# Patient Record
Sex: Female | Born: 1945 | Race: White | Hispanic: No | Marital: Married | Smoking: Never smoker
Health system: Southern US, Community
[De-identification: ages and names within clinical notes are randomized; demographics above are authoritative.]

## PROBLEM LIST (undated history)

## (undated) DIAGNOSIS — I825Z9 Chronic embolism and thrombosis of unspecified deep veins of unspecified distal lower extremity: Secondary | ICD-10-CM

## (undated) DIAGNOSIS — G459 Transient cerebral ischemic attack, unspecified: Secondary | ICD-10-CM

## (undated) DIAGNOSIS — I471 Supraventricular tachycardia, unspecified: Secondary | ICD-10-CM

## (undated) DIAGNOSIS — I6529 Occlusion and stenosis of unspecified carotid artery: Secondary | ICD-10-CM

## (undated) DIAGNOSIS — I059 Rheumatic mitral valve disease, unspecified: Secondary | ICD-10-CM

## (undated) DIAGNOSIS — I739 Peripheral vascular disease, unspecified: Secondary | ICD-10-CM

## (undated) DIAGNOSIS — G8929 Other chronic pain: Secondary | ICD-10-CM

## (undated) DIAGNOSIS — M549 Dorsalgia, unspecified: Secondary | ICD-10-CM

## (undated) DIAGNOSIS — E782 Mixed hyperlipidemia: Secondary | ICD-10-CM

## (undated) DIAGNOSIS — Z86718 Personal history of other venous thrombosis and embolism: Secondary | ICD-10-CM

## (undated) DIAGNOSIS — E539 Vitamin B deficiency, unspecified: Secondary | ICD-10-CM

## (undated) DIAGNOSIS — I079 Rheumatic tricuspid valve disease, unspecified: Secondary | ICD-10-CM

## (undated) DIAGNOSIS — R002 Palpitations: Secondary | ICD-10-CM

## (undated) DIAGNOSIS — F411 Generalized anxiety disorder: Secondary | ICD-10-CM

## (undated) DIAGNOSIS — M797 Fibromyalgia: Secondary | ICD-10-CM

## (undated) DIAGNOSIS — Z86711 Personal history of pulmonary embolism: Secondary | ICD-10-CM

## (undated) DIAGNOSIS — E7800 Pure hypercholesterolemia, unspecified: Secondary | ICD-10-CM

## (undated) DIAGNOSIS — I499 Cardiac arrhythmia, unspecified: Secondary | ICD-10-CM

## (undated) DIAGNOSIS — Z8781 Personal history of (healed) traumatic fracture: Secondary | ICD-10-CM

## (undated) DIAGNOSIS — M810 Age-related osteoporosis without current pathological fracture: Secondary | ICD-10-CM

## (undated) DIAGNOSIS — I10 Essential (primary) hypertension: Secondary | ICD-10-CM

## (undated) DIAGNOSIS — I429 Cardiomyopathy, unspecified: Secondary | ICD-10-CM

## (undated) HISTORY — DX: Pure hypercholesterolemia, unspecified: E78.00

## (undated) HISTORY — PX: OTHER SURGICAL HISTORY: SHX169

## (undated) HISTORY — DX: Dorsalgia, unspecified: M54.9

## (undated) HISTORY — DX: Other chronic pain: G89.29

## (undated) HISTORY — DX: Personal history of other venous thrombosis and embolism: Z86.718

---

## 2014-12-10 ENCOUNTER — Other Ambulatory Visit (INDEPENDENT_AMBULATORY_CARE_PROVIDER_SITE_OTHER): Payer: Self-pay | Admitting: Internal Medicine

## 2014-12-10 ENCOUNTER — Ambulatory Visit (INDEPENDENT_AMBULATORY_CARE_PROVIDER_SITE_OTHER): Payer: Medicare Other | Admitting: Internal Medicine

## 2014-12-10 ENCOUNTER — Encounter (INDEPENDENT_AMBULATORY_CARE_PROVIDER_SITE_OTHER): Payer: Self-pay | Admitting: Internal Medicine

## 2014-12-10 ENCOUNTER — Telehealth (INDEPENDENT_AMBULATORY_CARE_PROVIDER_SITE_OTHER): Payer: Self-pay | Admitting: *Deleted

## 2014-12-10 VITALS — BP 100/60 | HR 64 | Temp 98.1°F | Ht 67.0 in | Wt 189.0 lb

## 2014-12-10 DIAGNOSIS — M4120 Other idiopathic scoliosis, site unspecified: Secondary | ICD-10-CM

## 2014-12-10 DIAGNOSIS — M810 Age-related osteoporosis without current pathological fracture: Secondary | ICD-10-CM

## 2014-12-10 DIAGNOSIS — E7800 Pure hypercholesterolemia, unspecified: Secondary | ICD-10-CM | POA: Insufficient documentation

## 2014-12-10 DIAGNOSIS — K5730 Diverticulosis of large intestine without perforation or abscess without bleeding: Secondary | ICD-10-CM

## 2014-12-10 DIAGNOSIS — I809 Phlebitis and thrombophlebitis of unspecified site: Secondary | ICD-10-CM | POA: Diagnosis not present

## 2014-12-10 DIAGNOSIS — K219 Gastro-esophageal reflux disease without esophagitis: Secondary | ICD-10-CM

## 2014-12-10 MED ORDER — PEG 3350-KCL-NA BICARB-NACL 420 G PO SOLR: 4000.0000 mL | Freq: Once | ORAL | Status: DC

## 2014-12-10 NOTE — Telephone Encounter (Signed)
Patient needs trilyte 

## 2014-12-10 NOTE — Progress Notes (Addendum)
Subjective:    Patient ID: Ebony Olson, female    DOB: [birth year], 69 y.o.   MRN: [id number]  HPI Referred to our office by Dr. [facility]ian[facility], [place], [place]. Per notes in August she was seen at PCP for abdominal pain.  She underwent a CT  Which revealed mild diverticulosis centered in the sigmoid colon. No evidence of diverticulitis.   She had been taking Bactrim and Flagyl for the pain. She finished 2 days ago. Last Colonoscopy 09/01/2004 Dr. Elder Cyphers, left lower quadrant pain, tender to palpation. Normal colonoscopy other than diverticulosis. Her appetite is good. Weight loss 13 pounds recently which was intentional. She has minimal abdominal pain today. She denies fever or chills.  She usually has a BM every day.No melena or BRRB.  She has acid reflux if she does not eat right. If she eats properly, her acid reflux is controlled.   Hx of thrombophlebitis and maintained on Elquis and ASA.  Review of Systems   Past Medical History  Diagnosis Date  . Hx of blood clots     x 4 in rt leg  . Chronic back pain   . High cholesterol     Past Surgical History  Procedure Laterality Date  . Cyst on spine      Allergies  Allergen Reactions  . Penicillins     rash  . Prednisone     Could not breath    No current outpatient prescriptions on file prior to visit.   No current facility-administered medications on file prior to visit.   Current Outpatient Prescriptions  Medication Sig Dispense Refill  . albuterol (PROVENTIL HFA;VENTOLIN HFA) 108 (90 BASE) MCG/ACT inhaler Inhale into the lungs every 6 (six) hours as needed for wheezing or shortness of breath.    Marland Kitchen apixaban (ELIQUIS) 5 MG TABS tablet Take 5 mg by mouth 2 (two) times daily.    Marland Kitchen aspirin 81 MG tablet Take 81 mg by mouth daily.    . clonazePAM (KLONOPIN) 1 MG tablet Take 1 mg by mouth at bedtime.    Marland Kitchen denosumab (PROLIA) 60 MG/ML SOLN injection Inject 60 mg into the skin every 6 (six)  months. Administer in upper arm, thigh, or abdomen    . Dexlansoprazole (DEXILANT) 30 MG capsule Take 30 mg by mouth daily.    . furosemide (LASIX) 20 MG tablet Take 20 mg by mouth. 1/2 tab a day    . nortriptyline (PAMELOR) 50 MG capsule Take 100 mg by mouth at bedtime.    . simvastatin (ZOCOR) 80 MG tablet Take 80 mg by mouth daily.    . TraMADol HCl 50 MG TBDP Take by mouth.    . vitamin B-12 (CYANOCOBALAMIN) 1000 MCG tablet Take 1,000 mcg by mouth daily. Sq Once a month    . Vitamin D, Ergocalciferol, (DRISDOL) 50000 UNITS CAPS capsule Take 50,000 Units by mouth. One every two weeks     No current facility-administered medications for this visit.        Objective:   Physical Exam Blood pressure 100/60, pulse 64, temperature 98.1 F (36.7 C), height 5\' 7"  (1.702 m), weight 189 lb (85.73 kg). Alert and oriented. Skin warm and dry. Oral mucosa is moist.   . Sclera anicteric, conjunctivae is pink. Thyroid not enlarged. No cervical lymphadenopathy. Lungs clear. Heart regular rate and rhythm.  Abdomen is soft. Bowel sounds are positive. No hepatomegaly. No abdominal masses felt. No tenderness.  No edema to lower extremities.  Assessment & Plan:  Screening colonoscopy. The risks and benefits such as perforation, bleeding, and infection were reviewed with the patient and is agreeable. Will put out 8 weeks since she was empirically tx for diverticulitis.

## 2014-12-17 ENCOUNTER — Telehealth (INDEPENDENT_AMBULATORY_CARE_PROVIDER_SITE_OTHER): Payer: Self-pay | Admitting: *Deleted

## 2014-12-17 NOTE — Telephone Encounter (Signed)
Patient left message to cancel TCS sch'd 02/05/15, didn't give reason

## 2014-12-18 NOTE — Telephone Encounter (Signed)
noted 

## 2015-01-09 ENCOUNTER — Encounter (INDEPENDENT_AMBULATORY_CARE_PROVIDER_SITE_OTHER): Payer: Self-pay

## 2015-02-05 ENCOUNTER — Ambulatory Visit: Admit: 2015-02-05 | Payer: Self-pay | Admitting: Internal Medicine

## 2015-02-05 SURGERY — COLONOSCOPY
Anesthesia: Moderate Sedation

## 2015-10-23 ENCOUNTER — Other Ambulatory Visit: Payer: Self-pay | Admitting: Neurosurgery

## 2015-10-23 DIAGNOSIS — M47816 Spondylosis without myelopathy or radiculopathy, lumbar region: Secondary | ICD-10-CM

## 2015-10-30 ENCOUNTER — Ambulatory Visit
Admission: RE | Admit: 2015-10-30 | Discharge: 2015-10-30 | Disposition: A | Discharge status: Home / Self Care | Payer: Medicare Other | Source: Ambulatory Visit | Attending: Neurosurgery | Admitting: Neurosurgery

## 2015-10-30 DIAGNOSIS — M47816 Spondylosis without myelopathy or radiculopathy, lumbar region: Secondary | ICD-10-CM

## 2015-10-30 MED ORDER — METHYLPREDNISOLONE ACETATE 40 MG/ML INJ SUSP (RADIOLOG
120.0000 mg | Freq: Once | INTRAMUSCULAR | Status: AC
  Administered 2015-10-30: 120 mg via EPIDURAL

## 2015-10-30 MED ORDER — IOPAMIDOL (ISOVUE-M 200) INJECTION 41%
1.0000 mL | Freq: Once | INTRAMUSCULAR | Status: AC
  Administered 2015-10-30: 1 mL via EPIDURAL

## 2015-10-30 NOTE — Discharge Instructions (Addendum)

## 2015-11-05 ENCOUNTER — Other Ambulatory Visit: Payer: Self-pay | Admitting: Neurosurgery

## 2015-11-05 DIAGNOSIS — M47816 Spondylosis without myelopathy or radiculopathy, lumbar region: Secondary | ICD-10-CM

## 2015-11-13 ENCOUNTER — Ambulatory Visit
Admission: RE | Admit: 2015-11-13 | Discharge: 2015-11-13 | Disposition: A | Discharge status: Home / Self Care | Payer: Medicare Other | Source: Ambulatory Visit | Attending: Neurosurgery | Admitting: Neurosurgery

## 2015-11-13 DIAGNOSIS — M47816 Spondylosis without myelopathy or radiculopathy, lumbar region: Secondary | ICD-10-CM

## 2015-11-13 MED ORDER — IOPAMIDOL (ISOVUE-M 200) INJECTION 41%: 1.0000 mL | Freq: Once | INTRAMUSCULAR | Status: DC

## 2015-11-13 NOTE — Discharge Instructions (Addendum)
Bedrest Discharge Instructions  1. Go home and rest quietly for the next 24 hours.  It is important to lie flat for the next 24 hours.  Get up only to go to the restroom.  You may lie in the bed or on a couch on your back, your stomach, your left side or your right side.  You may have one pillow under your head.  You may have pillows between your knees while you are on your side or under your knees while you are on your back.  2. DO NOT drive today.  Recline the seat as far back as it will go, while still wearing your seat belt, on the way home.  3. You may get up to go to the bathroom as needed.  You may sit up for 10 minutes to eat.  You may resume your normal diet and medications unless otherwise indicated.  Drink lots of extra fluids today and tomorrow.  4. The incidence of headache, nausea, or vomiting is about 5% (one in 20 patients).  If you develop a headache, lie flat and drink plenty of fluids until the headache goes away.  Caffeinated beverages may be helpful.  If you develop severe nausea and vomiting or a headache that does not go away with flat bed rest, call the physician who sent you here.   5. You may resume normal activities after your 24 hours of bed rest is over; however, do not exert yourself strongly or do any heavy lifting tomorrow.  6. Call your physician for a follow-up appointment.   7. If you have any questions  after you arrive home, please call [phone].  Discharge instructions have been explained to the patient.  The patient, or the person responsible for the patient, fully understands these instructions.   Resume Eliquis today.

## 2015-11-13 NOTE — Progress Notes (Signed)
Called Dr. Pamala Hurry Alvarez's office to ask if Depo Medrol will interfere with her eye surgery scheduled in august. Office will call back after talking to the dr.

## 2015-11-14 ENCOUNTER — Telehealth: Payer: Self-pay | Admitting: Radiology

## 2015-11-14 NOTE — Telephone Encounter (Signed)
Called pt to let her know she could have her injection whenever she was ready.

## 2015-11-14 NOTE — Telephone Encounter (Signed)
Received call from Ebony Olson at [facility] that the steroid will have no effect on her eye surgery and she can have her injection when ever it suits her.

## 2016-09-22 ENCOUNTER — Other Ambulatory Visit: Payer: Self-pay | Admitting: Nurse Practitioner

## 2016-09-22 DIAGNOSIS — M47816 Spondylosis without myelopathy or radiculopathy, lumbar region: Secondary | ICD-10-CM

## 2016-09-29 ENCOUNTER — Ambulatory Visit
Admission: RE | Admit: 2016-09-29 | Discharge: 2016-09-29 | Disposition: A | Discharge status: Home / Self Care | Payer: Medicare Other | Source: Ambulatory Visit | Attending: Nurse Practitioner | Admitting: Nurse Practitioner

## 2016-09-29 DIAGNOSIS — M47816 Spondylosis without myelopathy or radiculopathy, lumbar region: Secondary | ICD-10-CM

## 2016-09-29 MED ORDER — IOPAMIDOL (ISOVUE-M 200) INJECTION 41%
1.0000 mL | Freq: Once | INTRAMUSCULAR | Status: AC
  Administered 2016-09-29: 1 mL via EPIDURAL

## 2016-09-29 MED ORDER — METHYLPREDNISOLONE ACETATE 40 MG/ML INJ SUSP (RADIOLOG
120.0000 mg | Freq: Once | INTRAMUSCULAR | Status: AC
  Administered 2016-09-29: 120 mg via EPIDURAL

## 2016-09-29 NOTE — Discharge Instructions (Signed)

## 2016-10-05 ENCOUNTER — Other Ambulatory Visit: Payer: Self-pay | Admitting: Nurse Practitioner

## 2016-10-05 DIAGNOSIS — M47816 Spondylosis without myelopathy or radiculopathy, lumbar region: Secondary | ICD-10-CM

## 2016-10-13 ENCOUNTER — Other Ambulatory Visit: Payer: Medicare Other

## 2016-10-14 ENCOUNTER — Ambulatory Visit
Admission: RE | Admit: 2016-10-14 | Discharge: 2016-10-14 | Disposition: A | Discharge status: Home / Self Care | Payer: Medicare Other | Source: Ambulatory Visit | Attending: Nurse Practitioner | Admitting: Nurse Practitioner

## 2016-10-14 DIAGNOSIS — M47816 Spondylosis without myelopathy or radiculopathy, lumbar region: Secondary | ICD-10-CM

## 2016-10-14 MED ORDER — IOPAMIDOL (ISOVUE-M 200) INJECTION 41%
1.0000 mL | Freq: Once | INTRAMUSCULAR | Status: AC
  Administered 2016-10-14: 1 mL via EPIDURAL

## 2016-10-14 MED ORDER — METHYLPREDNISOLONE ACETATE 40 MG/ML INJ SUSP (RADIOLOG
120.0000 mg | Freq: Once | INTRAMUSCULAR | Status: AC
  Administered 2016-10-14: 120 mg via EPIDURAL

## 2016-10-14 NOTE — Discharge Instructions (Signed)

## 2018-03-29 IMAGING — XA Imaging study
4 series · 4 of 4 positions shown · non-contrast
Comparison: none

CLINICAL DATA: Lumbosacral spondylosis without myelopathy. Right
greater than left low back pain. Numbness in lower extremities.
Partial response to the recent epidural injection at L3-4.

[Series 1: ortho standard · 1 of 1 slices shown (1 of 4)]
[im 1/1]
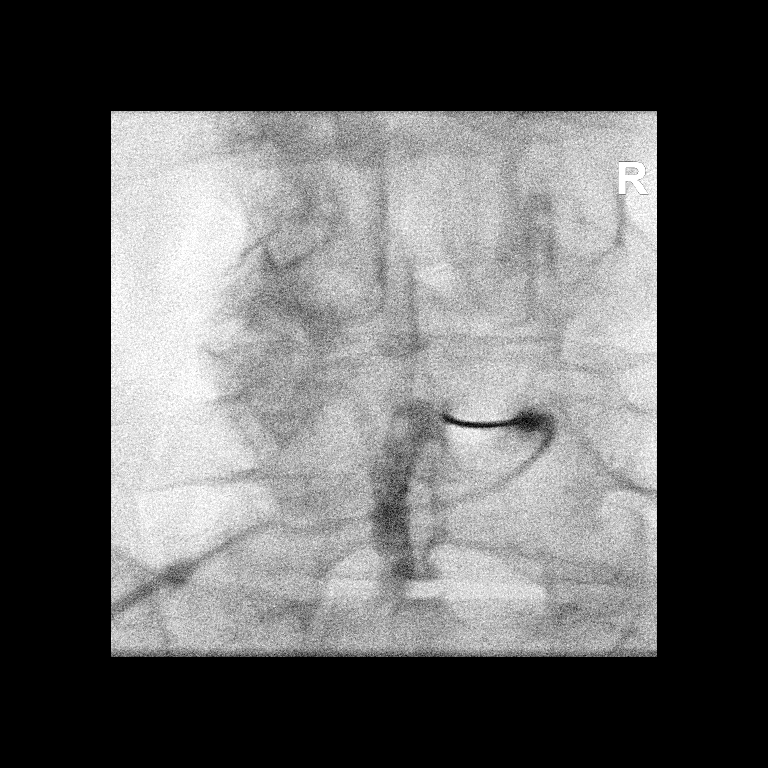

[Series 2: ortho standard · 1 of 1 slices shown (2 of 4)]
[im 1/1]
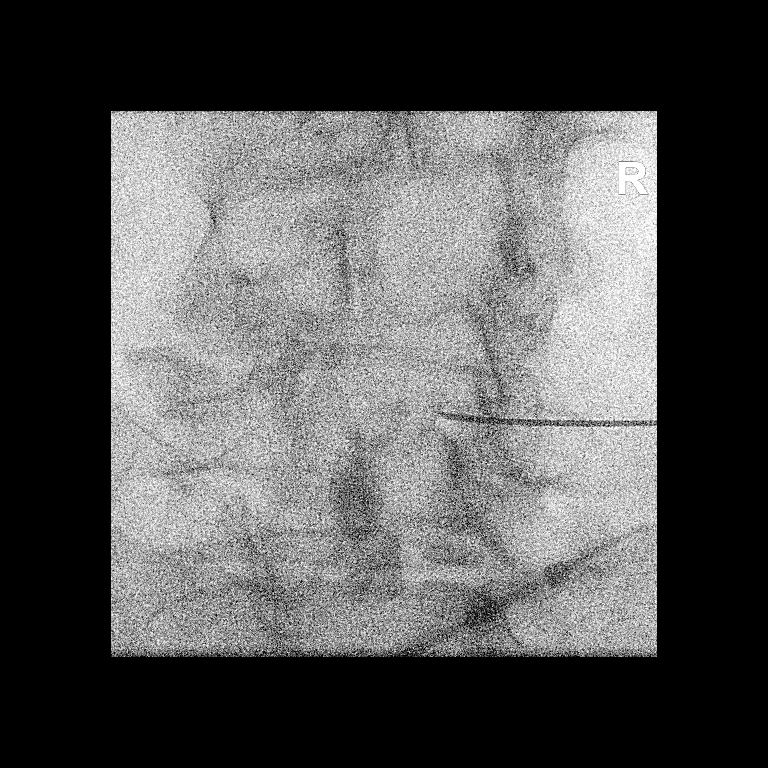

[Series 3: ortho standard · 1 of 1 slices shown (3 of 4)]
[im 1/1]
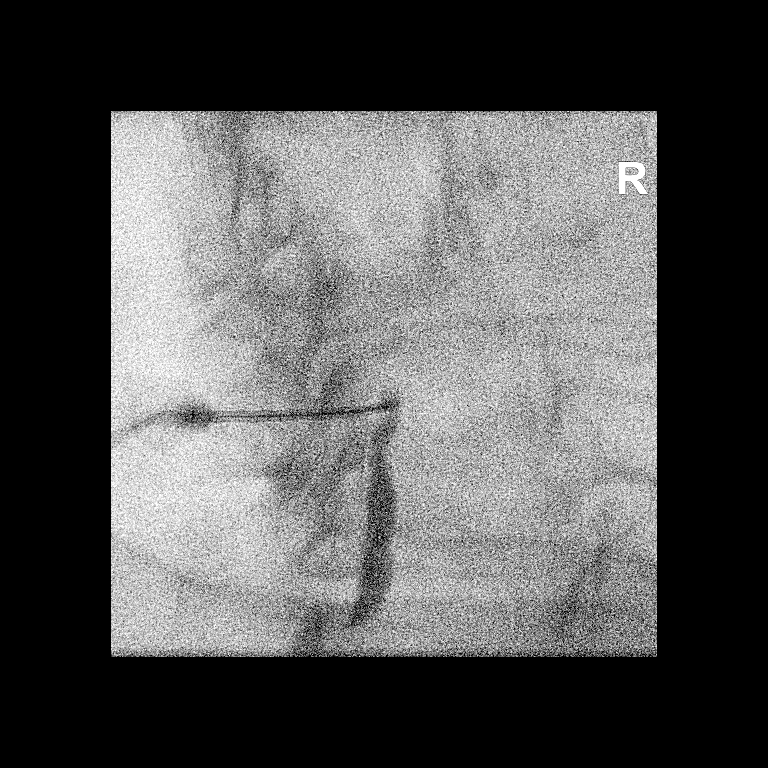

[Series 4: ortho standard · 1 of 1 slices shown (4 of 4)]
[im 1/1]
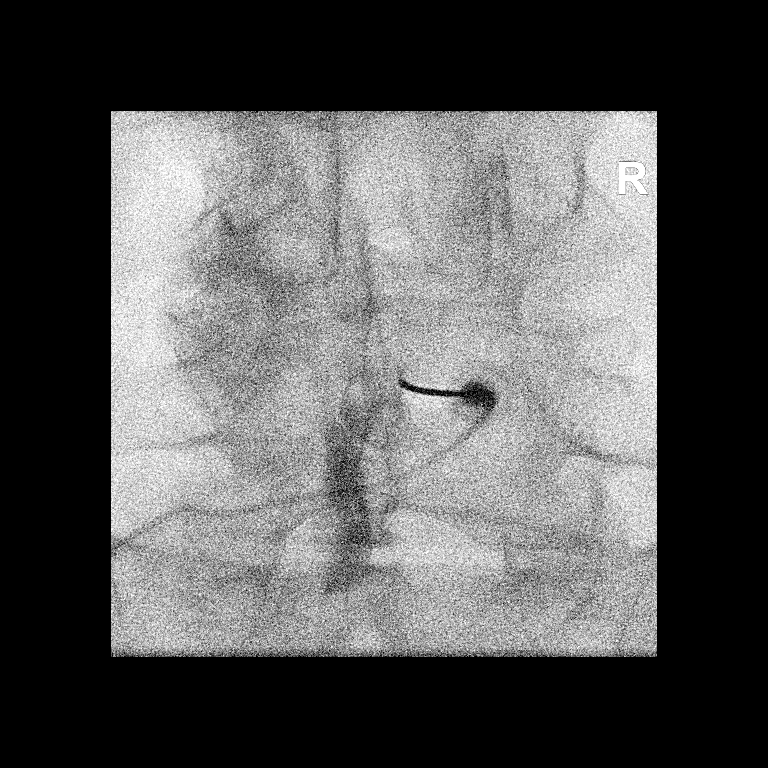

[4 of 4 positions shown; findings below may reference images not displayed]

FLUOROSCOPY TIME:  Radiation Exposure Index (as provided by the
fluoroscopic device): 10.50 microGray*m^2

Fluoroscopy Time (in minutes and seconds):  4 seconds

PROCEDURE:
The procedure, risks, benefits, and alternatives were explained to
the patient. Questions regarding the procedure were encouraged and
answered. The patient understands and consents to the procedure.

LUMBAR EPIDURAL INJECTION:

An interlaminar approach was performed on the right at L4-5. The
overlying skin was cleansed and anesthetized. A 3.5 inch 20 gauge
epidural needle was advanced using loss-of-resistance technique.

DIAGNOSTIC EPIDURAL INJECTION:

Injection of Isovue-M 200 shows a good epidural pattern with spread
above and below the level of needle placement. Contrast spread was
bilateral though with a significant caudal component crossing the
midline to the left despite right paramedian needle placement. No
vascular opacification is seen.

THERAPEUTIC EPIDURAL INJECTION:

120 mg of Depo-Medrol mixed with 3 mL of 1% lidocaine were
instilled. The procedure was well-tolerated, and the patient was
discharged thirty minutes following the injection in good condition.

COMPLICATIONS:
None
IMPRESSION: Technically successful lumbar interlaminar epidural injection at
L4-5.

## 2022-10-23 ENCOUNTER — Encounter (HOSPITAL_COMMUNITY): Payer: Self-pay

## 2022-10-24 ENCOUNTER — Inpatient Hospital Stay (HOSPITAL_COMMUNITY)
Admit: 2022-10-24 | Discharge: 2022-11-03 | DRG: 481 | Disposition: A | Discharge status: Skilled Nursing Facility | Payer: Medicare Other | Attending: Student | Admitting: Student

## 2022-10-24 ENCOUNTER — Inpatient Hospital Stay (HOSPITAL_COMMUNITY): Payer: Medicare Other | Admitting: Anesthesiology

## 2022-10-24 ENCOUNTER — Inpatient Hospital Stay (HOSPITAL_COMMUNITY): Payer: Medicare Other

## 2022-10-24 ENCOUNTER — Other Ambulatory Visit: Payer: Self-pay

## 2022-10-24 ENCOUNTER — Encounter (HOSPITAL_COMMUNITY): Payer: Self-pay | Admitting: Internal Medicine

## 2022-10-24 ENCOUNTER — Encounter (HOSPITAL_COMMUNITY)
Disposition: A | Discharge status: Skilled Nursing Facility | Payer: Self-pay | Source: Home / Self Care | Attending: Internal Medicine

## 2022-10-24 DIAGNOSIS — W1830XA Fall on same level, unspecified, initial encounter: Secondary | ICD-10-CM | POA: Diagnosis present

## 2022-10-24 DIAGNOSIS — G8929 Other chronic pain: Secondary | ICD-10-CM | POA: Diagnosis present

## 2022-10-24 DIAGNOSIS — S72141A Displaced intertrochanteric fracture of right femur, initial encounter for closed fracture: Secondary | ICD-10-CM

## 2022-10-24 DIAGNOSIS — D649 Anemia, unspecified: Secondary | ICD-10-CM

## 2022-10-24 DIAGNOSIS — F0390 Unspecified dementia without behavioral disturbance: Secondary | ICD-10-CM | POA: Diagnosis not present

## 2022-10-24 DIAGNOSIS — I10 Essential (primary) hypertension: Secondary | ICD-10-CM

## 2022-10-24 DIAGNOSIS — S72001A Fracture of unspecified part of neck of right femur, initial encounter for closed fracture: Secondary | ICD-10-CM | POA: Diagnosis not present

## 2022-10-24 DIAGNOSIS — E539 Vitamin B deficiency, unspecified: Secondary | ICD-10-CM | POA: Diagnosis present

## 2022-10-24 DIAGNOSIS — Z7982 Long term (current) use of aspirin: Secondary | ICD-10-CM

## 2022-10-24 DIAGNOSIS — H109 Unspecified conjunctivitis: Secondary | ICD-10-CM | POA: Diagnosis not present

## 2022-10-24 DIAGNOSIS — R195 Other fecal abnormalities: Secondary | ICD-10-CM | POA: Diagnosis not present

## 2022-10-24 DIAGNOSIS — Z880 Allergy status to penicillin: Secondary | ICD-10-CM

## 2022-10-24 DIAGNOSIS — F411 Generalized anxiety disorder: Secondary | ICD-10-CM | POA: Diagnosis present

## 2022-10-24 DIAGNOSIS — Z86711 Personal history of pulmonary embolism: Secondary | ICD-10-CM | POA: Diagnosis not present

## 2022-10-24 DIAGNOSIS — D62 Acute posthemorrhagic anemia: Secondary | ICD-10-CM | POA: Diagnosis not present

## 2022-10-24 DIAGNOSIS — E7800 Pure hypercholesterolemia, unspecified: Secondary | ICD-10-CM

## 2022-10-24 DIAGNOSIS — F03B4 Unspecified dementia, moderate, with anxiety: Secondary | ICD-10-CM

## 2022-10-24 DIAGNOSIS — Z7901 Long term (current) use of anticoagulants: Secondary | ICD-10-CM | POA: Diagnosis not present

## 2022-10-24 DIAGNOSIS — K219 Gastro-esophageal reflux disease without esophagitis: Secondary | ICD-10-CM | POA: Insufficient documentation

## 2022-10-24 DIAGNOSIS — M797 Fibromyalgia: Secondary | ICD-10-CM | POA: Diagnosis present

## 2022-10-24 DIAGNOSIS — I739 Peripheral vascular disease, unspecified: Secondary | ICD-10-CM | POA: Diagnosis present

## 2022-10-24 DIAGNOSIS — F418 Other specified anxiety disorders: Secondary | ICD-10-CM | POA: Diagnosis not present

## 2022-10-24 DIAGNOSIS — Z79899 Other long term (current) drug therapy: Secondary | ICD-10-CM

## 2022-10-24 DIAGNOSIS — F03B0 Unspecified dementia, moderate, without behavioral disturbance, psychotic disturbance, mood disturbance, and anxiety: Secondary | ICD-10-CM | POA: Diagnosis present

## 2022-10-24 DIAGNOSIS — E785 Hyperlipidemia, unspecified: Secondary | ICD-10-CM | POA: Diagnosis not present

## 2022-10-24 DIAGNOSIS — Y92009 Unspecified place in unspecified non-institutional (private) residence as the place of occurrence of the external cause: Secondary | ICD-10-CM | POA: Diagnosis not present

## 2022-10-24 DIAGNOSIS — E782 Mixed hyperlipidemia: Secondary | ICD-10-CM | POA: Diagnosis present

## 2022-10-24 DIAGNOSIS — Z8673 Personal history of transient ischemic attack (TIA), and cerebral infarction without residual deficits: Secondary | ICD-10-CM

## 2022-10-24 DIAGNOSIS — Z86718 Personal history of other venous thrombosis and embolism: Secondary | ICD-10-CM | POA: Diagnosis not present

## 2022-10-24 DIAGNOSIS — I825Y2 Chronic embolism and thrombosis of unspecified deep veins of left proximal lower extremity: Secondary | ICD-10-CM | POA: Diagnosis not present

## 2022-10-24 DIAGNOSIS — M549 Dorsalgia, unspecified: Secondary | ICD-10-CM | POA: Diagnosis present

## 2022-10-24 DIAGNOSIS — Z888 Allergy status to other drugs, medicaments and biological substances status: Secondary | ICD-10-CM

## 2022-10-24 DIAGNOSIS — I82409 Acute embolism and thrombosis of unspecified deep veins of unspecified lower extremity: Secondary | ICD-10-CM

## 2022-10-24 HISTORY — DX: Age-related osteoporosis without current pathological fracture: M81.0

## 2022-10-24 HISTORY — PX: INTRAMEDULLARY (IM) NAIL INTERTROCHANTERIC: SHX5875

## 2022-10-24 HISTORY — DX: Generalized anxiety disorder: F41.1

## 2022-10-24 HISTORY — DX: Chronic embolism and thrombosis of unspecified deep veins of unspecified distal lower extremity: I82.5Z9

## 2022-10-24 HISTORY — DX: Palpitations: R00.2

## 2022-10-24 HISTORY — DX: Personal history of pulmonary embolism: Z86.711

## 2022-10-24 HISTORY — DX: Peripheral vascular disease, unspecified: I73.9

## 2022-10-24 HISTORY — DX: Fibromyalgia: M79.7

## 2022-10-24 HISTORY — DX: Transient cerebral ischemic attack, unspecified: G45.9

## 2022-10-24 HISTORY — DX: Cardiomyopathy, unspecified: I42.9

## 2022-10-24 HISTORY — DX: Vitamin B deficiency, unspecified: E53.9

## 2022-10-24 HISTORY — DX: Essential (primary) hypertension: I10

## 2022-10-24 HISTORY — DX: Supraventricular tachycardia, unspecified: I47.10

## 2022-10-24 HISTORY — DX: Rheumatic mitral valve disease, unspecified: I05.9

## 2022-10-24 HISTORY — DX: Mixed hyperlipidemia: E78.2

## 2022-10-24 HISTORY — DX: Occlusion and stenosis of unspecified carotid artery: I65.29

## 2022-10-24 HISTORY — DX: Personal history of (healed) traumatic fracture: Z87.81

## 2022-10-24 HISTORY — DX: Rheumatic tricuspid valve disease, unspecified: I07.9

## 2022-10-24 HISTORY — DX: Cardiac arrhythmia, unspecified: I49.9

## 2022-10-24 LAB — PREPARE RBC (CROSSMATCH)

## 2022-10-24 LAB — BPAM RBC
Blood Product Expiration Date: 202407282359
ISSUE DATE / TIME: 202407071104
Unit Type and Rh: 5100

## 2022-10-24 LAB — BASIC METABOLIC PANEL
Anion gap: 7 (ref 5–15)
BUN: 20 mg/dL (ref 8–23)
CO2: 25 mmol/L (ref 22–32)
Calcium: 8.1 mg/dL — ABNORMAL LOW (ref 8.9–10.3)
Chloride: 107 mmol/L (ref 98–111)
Creatinine, Ser: 1.06 mg/dL — ABNORMAL HIGH (ref 0.44–1.00)
GFR, Estimated: 55 mL/min — ABNORMAL LOW (ref >60)
Glucose, Bld: 111 mg/dL — ABNORMAL HIGH (ref 70–99)
Potassium: 4 mmol/L (ref 3.5–5.1)
Sodium: 139 mmol/L (ref 135–145)

## 2022-10-24 LAB — TYPE AND SCREEN
Unit division: 0
Unit division: 0

## 2022-10-24 LAB — CBC
HCT: 24.4 % — ABNORMAL LOW (ref 36.0–46.0)
Hemoglobin: 7.8 g/dL — ABNORMAL LOW (ref 12.0–15.0)
MCH: 31.1 pg (ref 26.0–34.0)
MCHC: 32 g/dL (ref 30.0–36.0)
MCV: 97.2 fL (ref 80.0–100.0)
Platelets: 230 10*3/uL (ref 150–400)
RBC: 2.51 MIL/uL — ABNORMAL LOW (ref 3.87–5.11)
RDW: 24.4 % — ABNORMAL HIGH (ref 11.5–15.5)
WBC: 8.3 10*3/uL (ref 4.0–10.5)
nRBC: 0.2 % (ref 0.0–0.2)

## 2022-10-24 LAB — SURGICAL PCR SCREEN
MRSA, PCR: NEGATIVE
Staphylococcus aureus: NEGATIVE

## 2022-10-24 LAB — PROTIME-INR
INR: 1.2 (ref 0.8–1.2)
Prothrombin Time: 15.6 seconds — ABNORMAL HIGH (ref 11.4–15.2)

## 2022-10-24 LAB — APTT: aPTT: 32 seconds (ref 24–36)

## 2022-10-24 LAB — ABO/RH: ABO/RH(D): O POS

## 2022-10-24 SURGERY — FIXATION, FRACTURE, INTERTROCHANTERIC, WITH INTRAMEDULLARY ROD
Anesthesia: General | Laterality: Right

## 2022-10-24 MED ORDER — NORTRIPTYLINE HCL 25 MG PO CAPS
100.0000 mg | ORAL_CAPSULE | Freq: Every day | ORAL | Status: DC
  Filled 2022-10-24: qty 4

## 2022-10-24 MED ORDER — FENTANYL CITRATE (PF) 100 MCG/2ML IJ SOLN
INTRAMUSCULAR | Status: AC
  Filled 2022-10-24: qty 2

## 2022-10-24 MED ORDER — PHENYLEPHRINE 80 MCG/ML (10ML) SYRINGE FOR IV PUSH (FOR BLOOD PRESSURE SUPPORT)
PREFILLED_SYRINGE | INTRAVENOUS | Status: AC
  Filled 2022-10-24: qty 30

## 2022-10-24 MED ORDER — ONDANSETRON HCL 4 MG PO TABS: 4.0000 mg | ORAL_TABLET | Freq: Four times a day (QID) | ORAL | Status: DC | PRN

## 2022-10-24 MED ORDER — SUGAMMADEX SODIUM 200 MG/2ML IV SOLN
INTRAVENOUS | Status: DC | PRN
  Administered 2022-10-24: 200 mg via INTRAVENOUS

## 2022-10-24 MED ORDER — PROPOFOL 10 MG/ML IV BOLUS
INTRAVENOUS | Status: AC
  Filled 2022-10-24: qty 20

## 2022-10-24 MED ORDER — ONDANSETRON HCL 4 MG/2ML IJ SOLN
INTRAMUSCULAR | Status: DC | PRN
  Administered 2022-10-24: 4 mg via INTRAVENOUS

## 2022-10-24 MED ORDER — CEFAZOLIN SODIUM-DEXTROSE 2-4 GM/100ML-% IV SOLN
INTRAVENOUS | Status: AC
  Filled 2022-10-24: qty 100

## 2022-10-24 MED ORDER — METOCLOPRAMIDE HCL 5 MG/ML IJ SOLN
5.0000 mg | Freq: Three times a day (TID) | INTRAMUSCULAR | Status: DC | PRN
  Administered 2022-11-01: 10 mg via INTRAVENOUS
  Filled 2022-10-24: qty 2

## 2022-10-24 MED ORDER — VASOPRESSIN 20 UNIT/ML IV SOLN
INTRAVENOUS | Status: AC
  Filled 2022-10-24: qty 1

## 2022-10-24 MED ORDER — LACTATED RINGERS IV SOLN: INTRAVENOUS | Status: DC

## 2022-10-24 MED ORDER — VASOPRESSIN 20 UNIT/ML IV SOLN
INTRAVENOUS | Status: DC | PRN
  Administered 2022-10-24: 1 [IU] via INTRAVENOUS

## 2022-10-24 MED ORDER — 0.9 % SODIUM CHLORIDE (POUR BTL) OPTIME
TOPICAL | Status: DC | PRN
  Administered 2022-10-24: 1000 mL

## 2022-10-24 MED ORDER — LACTATED RINGERS IV SOLN: INTRAVENOUS | Status: AC

## 2022-10-24 MED ORDER — ORAL CARE MOUTH RINSE: 15.0000 mL | Freq: Once | OROMUCOSAL | Status: AC

## 2022-10-24 MED ORDER — DOCUSATE SODIUM 100 MG PO CAPS
100.0000 mg | ORAL_CAPSULE | Freq: Two times a day (BID) | ORAL | Status: DC
  Administered 2022-10-24 – 2022-11-02 (×17): 100 mg via ORAL
  Filled 2022-10-24 (×19): qty 1

## 2022-10-24 MED ORDER — ONDANSETRON HCL 4 MG/2ML IJ SOLN
INTRAMUSCULAR | Status: AC
  Filled 2022-10-24: qty 4

## 2022-10-24 MED ORDER — CHLORHEXIDINE GLUCONATE 0.12 % MT SOLN: 15.0000 mL | Freq: Once | OROMUCOSAL | Status: AC

## 2022-10-24 MED ORDER — ONDANSETRON HCL 4 MG/2ML IJ SOLN: 4.0000 mg | Freq: Four times a day (QID) | INTRAMUSCULAR | Status: DC | PRN

## 2022-10-24 MED ORDER — OXYCODONE-ACETAMINOPHEN 5-325 MG PO TABS
1.0000 | ORAL_TABLET | Freq: Four times a day (QID) | ORAL | Status: DC | PRN
  Administered 2022-10-24 – 2022-11-02 (×11): 1 via ORAL
  Filled 2022-10-24 (×16): qty 1

## 2022-10-24 MED ORDER — METHOCARBAMOL 500 MG PO TABS
500.0000 mg | ORAL_TABLET | Freq: Four times a day (QID) | ORAL | Status: DC | PRN
  Administered 2022-10-24 – 2022-11-03 (×5): 500 mg via ORAL
  Filled 2022-10-24 (×6): qty 1

## 2022-10-24 MED ORDER — ALBUMIN HUMAN 5 % IV SOLN: INTRAVENOUS | Status: DC | PRN

## 2022-10-24 MED ORDER — CLONAZEPAM 0.5 MG PO TABS
1.0000 mg | ORAL_TABLET | Freq: Every day | ORAL | Status: DC
  Filled 2022-10-24: qty 2

## 2022-10-24 MED ORDER — PHENYLEPHRINE HCL-NACL 20-0.9 MG/250ML-% IV SOLN
INTRAVENOUS | Status: DC | PRN
  Administered 2022-10-24: 50 ug/min via INTRAVENOUS
  Administered 2022-10-24: 100 ug via INTRAVENOUS

## 2022-10-24 MED ORDER — METHOCARBAMOL 1000 MG/10ML IJ SOLN: 500.0000 mg | Freq: Four times a day (QID) | INTRAVENOUS | Status: DC | PRN

## 2022-10-24 MED ORDER — FENTANYL CITRATE (PF) 250 MCG/5ML IJ SOLN
INTRAMUSCULAR | Status: AC
  Filled 2022-10-24: qty 5

## 2022-10-24 MED ORDER — ATORVASTATIN CALCIUM 40 MG PO TABS
40.0000 mg | ORAL_TABLET | Freq: Every day | ORAL | Status: DC
  Administered 2022-10-26 – 2022-11-02 (×8): 40 mg via ORAL
  Filled 2022-10-24 (×10): qty 1

## 2022-10-24 MED ORDER — ALBUTEROL SULFATE (2.5 MG/3ML) 0.083% IN NEBU: 2.5000 mg | INHALATION_SOLUTION | Freq: Four times a day (QID) | RESPIRATORY_TRACT | Status: DC | PRN

## 2022-10-24 MED ORDER — PROPOFOL 10 MG/ML IV BOLUS
INTRAVENOUS | Status: DC | PRN
  Administered 2022-10-24: 80 mg via INTRAVENOUS

## 2022-10-24 MED ORDER — TRANEXAMIC ACID-NACL 1000-0.7 MG/100ML-% IV SOLN
1000.0000 mg | Freq: Once | INTRAVENOUS | Status: AC
  Administered 2022-10-24: 1000 mg via INTRAVENOUS
  Filled 2022-10-24: qty 100

## 2022-10-24 MED ORDER — LIDOCAINE 2% (20 MG/ML) 5 ML SYRINGE
INTRAMUSCULAR | Status: AC
  Filled 2022-10-24: qty 10

## 2022-10-24 MED ORDER — EPHEDRINE 5 MG/ML INJ
INTRAVENOUS | Status: AC
  Filled 2022-10-24: qty 5

## 2022-10-24 MED ORDER — SODIUM CHLORIDE 0.9% IV SOLUTION: Freq: Once | INTRAVENOUS | Status: DC

## 2022-10-24 MED ORDER — MORPHINE SULFATE (PF) 2 MG/ML IV SOLN
0.5000 mg | INTRAVENOUS | Status: DC | PRN
  Administered 2022-10-24 – 2022-10-25 (×2): 1 mg via INTRAVENOUS
  Filled 2022-10-24 (×2): qty 1

## 2022-10-24 MED ORDER — CEFAZOLIN SODIUM-DEXTROSE 2-4 GM/100ML-% IV SOLN
2.0000 g | INTRAVENOUS | Status: AC
  Administered 2022-10-24: 2 g via INTRAVENOUS

## 2022-10-24 MED ORDER — CEFAZOLIN SODIUM-DEXTROSE 2-4 GM/100ML-% IV SOLN
2.0000 g | Freq: Three times a day (TID) | INTRAVENOUS | Status: AC
  Administered 2022-10-24 – 2022-10-25 (×3): 2 g via INTRAVENOUS
  Filled 2022-10-24 (×3): qty 100

## 2022-10-24 MED ORDER — PHENYLEPHRINE HCL (PRESSORS) 10 MG/ML IV SOLN
INTRAVENOUS | Status: DC | PRN
  Administered 2022-10-24: 80 ug via INTRAVENOUS
  Administered 2022-10-24: 1 ug via INTRAVENOUS
  Administered 2022-10-24: 80 ug via INTRAVENOUS
  Administered 2022-10-24 (×3): 160 ug via INTRAVENOUS

## 2022-10-24 MED ORDER — ONDANSETRON HCL 4 MG/2ML IJ SOLN
4.0000 mg | Freq: Four times a day (QID) | INTRAMUSCULAR | Status: DC | PRN
  Administered 2022-10-24 – 2022-11-01 (×2): 4 mg via INTRAVENOUS
  Filled 2022-10-24 (×3): qty 2

## 2022-10-24 MED ORDER — PANTOPRAZOLE SODIUM 40 MG PO TBEC
40.0000 mg | DELAYED_RELEASE_TABLET | Freq: Every day | ORAL | Status: DC
  Administered 2022-10-25 – 2022-10-29 (×5): 40 mg via ORAL
  Filled 2022-10-24 (×5): qty 1

## 2022-10-24 MED ORDER — CHLORHEXIDINE GLUCONATE 0.12 % MT SOLN
OROMUCOSAL | Status: AC
  Administered 2022-10-24: 15 mL via OROMUCOSAL
  Filled 2022-10-24: qty 15

## 2022-10-24 MED ORDER — SUCCINYLCHOLINE CHLORIDE 200 MG/10ML IV SOSY
PREFILLED_SYRINGE | INTRAVENOUS | Status: DC | PRN
  Administered 2022-10-24: 100 mg via INTRAVENOUS

## 2022-10-24 MED ORDER — ACETAMINOPHEN 650 MG RE SUPP: 650.0000 mg | Freq: Four times a day (QID) | RECTAL | Status: DC | PRN

## 2022-10-24 MED ORDER — DEXAMETHASONE SODIUM PHOSPHATE 10 MG/ML IJ SOLN
INTRAMUSCULAR | Status: AC
  Filled 2022-10-24: qty 1

## 2022-10-24 MED ORDER — ROCURONIUM 10MG/ML (10ML) SYRINGE FOR MEDFUSION PUMP - OPTIME
INTRAVENOUS | Status: DC | PRN
  Administered 2022-10-24: 40 mg via INTRAVENOUS

## 2022-10-24 MED ORDER — METOCLOPRAMIDE HCL 5 MG PO TABS: 5.0000 mg | ORAL_TABLET | Freq: Three times a day (TID) | ORAL | Status: DC | PRN

## 2022-10-24 MED ORDER — ROCURONIUM BROMIDE 10 MG/ML (PF) SYRINGE
PREFILLED_SYRINGE | INTRAVENOUS | Status: AC
  Filled 2022-10-24: qty 20

## 2022-10-24 MED ORDER — ACETAMINOPHEN 325 MG PO TABS: 650.0000 mg | ORAL_TABLET | Freq: Four times a day (QID) | ORAL | Status: DC | PRN

## 2022-10-24 MED ORDER — FENTANYL CITRATE (PF) 100 MCG/2ML IJ SOLN
25.0000 ug | INTRAMUSCULAR | Status: DC | PRN
  Administered 2022-10-24 (×2): 50 ug via INTRAVENOUS
  Administered 2022-10-24: 100 ug via INTRAVENOUS

## 2022-10-24 MED ORDER — ACETAMINOPHEN 325 MG PO TABS
650.0000 mg | ORAL_TABLET | Freq: Four times a day (QID) | ORAL | Status: DC | PRN
  Administered 2022-10-28 – 2022-11-03 (×7): 650 mg via ORAL
  Filled 2022-10-24 (×8): qty 2

## 2022-10-24 MED ORDER — AMISULPRIDE (ANTIEMETIC) 5 MG/2ML IV SOLN: 10.0000 mg | Freq: Once | INTRAVENOUS | Status: DC | PRN

## 2022-10-24 MED ORDER — POLYETHYLENE GLYCOL 3350 17 G PO PACK
17.0000 g | PACK | Freq: Every day | ORAL | Status: DC | PRN
  Administered 2022-10-27: 17 g via ORAL
  Filled 2022-10-24: qty 1

## 2022-10-24 SURGICAL SUPPLY — 50 items
ADH SKN CLS APL DERMABOND .7 (GAUZE/BANDAGES/DRESSINGS) ×1
ADH SKN CLS LQ APL DERMABOND (GAUZE/BANDAGES/DRESSINGS) ×1
APL PRP STRL LF DISP 70% ISPRP (MISCELLANEOUS) ×1
BAG COUNTER SPONGE SURGICOUNT (BAG) IMPLANT
BAG SPNG CNTER NS LX DISP (BAG)
BIT DRILL INTERTAN LAG SCREW (BIT) IMPLANT
BIT DRILL LONG 4.0 (BIT) IMPLANT
BRUSH SCRUB EZ PLAIN DRY (MISCELLANEOUS) ×2 IMPLANT
CHLORAPREP W/TINT 26 (MISCELLANEOUS) ×1 IMPLANT
COVER PERINEAL POST (MISCELLANEOUS) ×1 IMPLANT
COVER SURGICAL LIGHT HANDLE (MISCELLANEOUS) ×1 IMPLANT
DERMABOND ADVANCED .7 DNX12 (GAUZE/BANDAGES/DRESSINGS) ×1 IMPLANT
DERMABOND ADVANCED .7 DNX6 (GAUZE/BANDAGES/DRESSINGS) IMPLANT
DRAPE C-ARM 35X43 STRL (DRAPES) ×1 IMPLANT
DRAPE IMP U-DRAPE 54X76 (DRAPES) ×2 IMPLANT
DRAPE INCISE IOBAN 66X45 STRL (DRAPES) ×1 IMPLANT
DRAPE STERI IOBAN 125X83 (DRAPES) ×1 IMPLANT
DRAPE SURG 17X23 STRL (DRAPES) ×2 IMPLANT
DRAPE U-SHAPE 47X51 STRL (DRAPES) ×1 IMPLANT
DRESSING MEPILEX FLEX 4X4 (GAUZE/BANDAGES/DRESSINGS) ×1 IMPLANT
DRILL BIT LONG 4.0 (BIT) ×1
DRSG MEPILEX FLEX 4X4 (GAUZE/BANDAGES/DRESSINGS) ×2
DRSG MEPILEX POST OP 4X8 (GAUZE/BANDAGES/DRESSINGS) ×1 IMPLANT
ELECT REM PT RETURN 9FT ADLT (ELECTROSURGICAL) ×1
ELECTRODE REM PT RTRN 9FT ADLT (ELECTROSURGICAL) ×1 IMPLANT
GLOVE BIO SURGEON STRL SZ 6.5 (GLOVE) ×3 IMPLANT
GLOVE BIO SURGEON STRL SZ7.5 (GLOVE) ×4 IMPLANT
GLOVE BIOGEL PI IND STRL 6.5 (GLOVE) ×1 IMPLANT
GLOVE BIOGEL PI IND STRL 7.5 (GLOVE) ×1 IMPLANT
GOWN STRL REUS W/ TWL LRG LVL3 (GOWN DISPOSABLE) ×1 IMPLANT
GOWN STRL REUS W/TWL LRG LVL3 (GOWN DISPOSABLE) ×3
GUIDE PIN 3.2X343 (PIN) ×2
GUIDE PIN 3.2X343MM (PIN) ×2
KIT BASIN OR (CUSTOM PROCEDURE TRAY) ×1 IMPLANT
KIT TURNOVER KIT B (KITS) ×1 IMPLANT
MANIFOLD NEPTUNE II (INSTRUMENTS) ×1 IMPLANT
NAIL INTERTAN 10X18 130D 10S (Nail) IMPLANT
NS IRRIG 1000ML POUR BTL (IV SOLUTION) ×1 IMPLANT
PACK GENERAL/GYN (CUSTOM PROCEDURE TRAY) ×1 IMPLANT
PAD ARMBOARD 7.5X6 YLW CONV (MISCELLANEOUS) ×2 IMPLANT
PIN GUIDE 3.2X343MM (PIN) IMPLANT
SCREW LAG COMPR KIT 105/100 (Screw) IMPLANT
SCREW TRIGEN LOW PROF 5.0X32.5 (Screw) IMPLANT
SUT MNCRL AB 3-0 PS2 18 (SUTURE) ×1 IMPLANT
SUT VIC AB 0 CT1 27 (SUTURE)
SUT VIC AB 0 CT1 27XBRD ANBCTR (SUTURE) IMPLANT
SUT VIC AB 2-0 CT1 27 (SUTURE) ×2
SUT VIC AB 2-0 CT1 TAPERPNT 27 (SUTURE) ×2 IMPLANT
TOWEL GREEN STERILE (TOWEL DISPOSABLE) ×2 IMPLANT
WATER STERILE IRR 1000ML POUR (IV SOLUTION) ×1 IMPLANT

## 2022-10-24 NOTE — Assessment & Plan Note (Addendum)
Continue clonazepam and nortriptyline.

## 2022-10-24 NOTE — H&P (View-Only) (Signed)
Orthopaedic Trauma Service (OTS) Consult   Patient ID: Ebony Olson MRN: [id number] DOB/AGE: 75/20/1947 77 y.o.  Reason for Consult: Right hip fracture Referring Physician: [place] emergency department  HPI: Ebony Olson is an 77 y.o. female with a history of dementia as well as extensive DVT in the left lower extremity in 2023 (on Eliquis) being seen in consultation for right intertrochanteric femur fracture.  Patient fell while at home yesterday.  Had immediate pain in the right hip was unable to ambulate.  Was seen in [place] emergency department and found to have right intertrochanteric femur fracture.  Orthopedics was consulted for evaluation and management.  Patient subsequently transferred down to [facility] for surgical intervention.  Patient admitted to the hospitalist service. Patient seen this morning on 5N.  Daughter and son are at bedside.  Patient notes pain is fairly well-controlled currently.  Denies any numbness or tingling throughout the right lower extremity.  No previous injury or surgeries to the right lower extremity.  Patient had an extensive DVT in the left lower extremity in 2023 which required removal.  She has been on Eliquis since then.  Last dose of Eliquis was Saturday morning (10/23/2022).  Patient lives at home with her husband.  Ambulates at baseline with a walker.  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  Social History:  reports that she has never smoked. She does not have any smokeless tobacco history on file. She reports that she does not drink alcohol and does not use drugs.  Allergies:  Allergies   Allergen Reactions   Lunesta [Eszopiclone] Shortness Of Breath   Prednisone Anxiety   Penicillins Rash    Medications: I have reviewed the patient's current medications. Prior to Admission:  Medications Prior to Admission  Medication Sig Dispense Refill Last Dose   albuterol (PROVENTIL HFA;VENTOLIN HFA) 108 (90 BASE) MCG/ACT inhaler Inhale into the lungs every 6 (six) hours as needed for wheezing or shortness of breath.      apixaban (ELIQUIS) 5 MG TABS tablet Take 5 mg by mouth 2 (two) times daily.      aspirin 81 MG tablet Take 81 mg by mouth daily.      clonazePAM (KLONOPIN) 1 MG tablet Take 1 mg by mouth at bedtime.      denosumab (PROLIA) 60 MG/ML SOLN injection Inject 60 mg into the skin every 6 (six) months. Administer in upper arm, thigh, or abdomen      Dexlansoprazole (DEXILANT) 30 MG capsule Take 30 mg by mouth daily.      furosemide (LASIX) 20 MG tablet Take 20 mg by mouth. 1/2 tab a day      nortriptyline (PAMELOR) 50 MG capsule Take 100 mg by mouth at bedtime.      polyethylene glycol-electrolytes (NULYTELY/GOLYTELY) 420 G solution Take 4,000 mLs by mouth once. 4000 mL 0    simvastatin (ZOCOR) 80 MG tablet Take 80 mg by mouth daily.      TraMADol HCl 50 MG TBDP Take by mouth.        vitamin B-12 (CYANOCOBALAMIN) 1000 MCG tablet Take 1,000 mcg by mouth daily. Sq Once a month      Vitamin D, Ergocalciferol, (DRISDOL) 50000 UNITS CAPS capsule Take 50,000 Units by mouth. One every two weeks       ROS: Constitutional: No fever or chills Vision: No changes in vision ENT: No difficulty swallowing CV: No chest pain Pulm: No SOB or wheezing GI: No nausea or vomiting GU: No urgency or inability to hold urine Skin: No poor wound healing Neurologic: No numbness or tingling Psychiatric: No depression or anxiety Heme: No bruising Allergic: No reaction to medications or food   Exam: Blood pressure (!) 101/47, pulse (!) 119, temperature 97.6 F (36.4 C), resp. rate 16, height  5' 7.01" (1.702 m), weight 73.2 kg, SpO2 97 %. General: Resting in bed comfortably, no acute distress Orientation: Alert and oriented to person, place, situation Mood and Affect: Mood and affect appropriate, pleasant and cooperative Gait: Not assessed due to known fracture Coordination and balance: Within normal limits  Right lower extremity: Leg slightly externally rotated.  Tender about the hip as expected.  No significant tenderness to the thigh or lower leg.  No calf tenderness.  Tolerates some gentle ankle dorsiflexion and plantarflexion.  Able to wiggle toes.  Dorsal sensation all aspects of the foot.  Neurovascularly intact  Left lower extremity: Baseline swelling throughout the extremity.  Skin without lesions. No tenderness to palpation. Full painless ROM, full strength in each muscle groups without evidence of instability.  Motor and sensory function at baseline.  Neurovascularly intact   Medical Decision Making: Data: Imaging: AP pelvis with AP and lateral views of the right hip show intertrochanteric femur fracture  Labs: No results found for this or any previous visit (from the past 24 hour(s)).   Assessment/Plan: 77-year-old female with baseline dementia and history of DVT left lower extremity on Eliquis presenting with right intertrochanteric femur fracture  Patient was significant injury to right lower extremity which require surgical intervention.  Recommend proceeding with intramedullary nailing right intertrochanteric femur fracture.  Will plan to proceed with this procedure after this morning.  Risk and benefits of procedure were discussed with the patient, her daughter, and son at bedside. Risks discussed included bleeding, infection, malunion, nonunion, damage to surrounding nerves and blood vessels, pain, hardware prominence or irritation, hardware failure, stiffness, post-traumatic arthritis, DVT/PE, compartment syndrome, and even anesthesia complications.  Patient and  her family state their understanding of these risks and agreed to proceed with surgery.  Consent will be obtained.  Please keep patient n.p.o.  Mannat Benedetti Y. Kelsie Zaborowski PA-C [facility] [phone] (office) [email or web address]  

## 2022-10-24 NOTE — Plan of Care (Signed)
  Problem: Clinical Measurements: Goal: Diagnostic test results will improve Outcome: Not Progressing   Problem: Elimination: Goal: Will not experience complications related to bowel motility Outcome: Not Progressing   Problem: Coping: Goal: Level of anxiety will decrease Outcome: Not Progressing   Problem: Safety: Goal: Ability to remain free from injury will improve Outcome: Not Progressing

## 2022-10-24 NOTE — Anesthesia Procedure Notes (Addendum)
Arterial Line Insertion Start/End7/10/2022 10:32 AM, 10/24/2022 10:42 AM Performed by: [name], MD  Patient location: Pre-op. Preanesthetic checklist: patient identified, IV checked, site marked, risks and benefits discussed, surgical consent, monitors and equipment checked, pre-op evaluation, timeout performed and anesthesia consent Lidocaine 1% used for infiltration Left, radial was placed Catheter size: 20 G Hand hygiene performed , maximum sterile barriers used  and Seldinger technique used  Attempts: 1 Procedure performed using ultrasound guided technique. Ultrasound Notes:anatomy identified, needle tip was noted to be adjacent to the nerve/plexus identified, no ultrasound evidence of intravascular and/or intraneural injection and image(s) printed for medical record Following insertion, dressing applied and Biopatch. Post procedure assessment: normal and unchanged  Patient tolerated the procedure well with no immediate complications.

## 2022-10-24 NOTE — Progress Notes (Signed)
Stat type and screen order placed, verbal per MD. Pt transferred off unit to OR

## 2022-10-24 NOTE — Transfer of Care (Signed)
Immediate Anesthesia Transfer of Care Note  Patient: Ebony Olson  Procedure(s) Performed: INTRAMEDULLARY (IM) NAIL INTERTROCHANTERIC (Right)  Patient Location: PACU  Anesthesia Type:General  Level of Consciousness: awake and alert   Airway & Oxygen Therapy: Patient Spontanous Breathing and Patient connected to face mask oxygen  Post-op Assessment: Report given to RN and Post -op Vital signs reviewed and stable  Post vital signs: Reviewed and stable  Last Vitals:  Vitals Value Taken Time  BP 102/48 10/24/22 1130  Temp 36.4 C 10/24/22 1138  Pulse 112 10/24/22 1145  Resp 13 10/24/22 1145  SpO2 99 % 10/24/22 1145  Vitals shown include unvalidated device data.  Last Pain:  Vitals:   10/24/22 1138  TempSrc: Oral  PainSc:          Complications: No notable events documented.

## 2022-10-24 NOTE — Interval H&P Note (Signed)
History and Physical Interval Note:  10/24/2022 9:42 AM  Ebony Olson  has presented today for surgery, with the diagnosis of Right intertrochanteric femur fracture.  The various methods of treatment have been discussed with the patient and family. After consideration of risks, benefits and other options for treatment, the patient has consented to  Procedure(s): INTRAMEDULLARY (IM) NAIL INTERTROCHANTERIC (Right) as a surgical intervention.  The patient's history has been reviewed, patient examined, no change in status, stable for surgery.  I have reviewed the patient's chart and labs.  Questions were answered to the patient's satisfaction.     Caryn Bee P [place] [facility]gr[facility]

## 2022-10-24 NOTE — Assessment & Plan Note (Deleted)
Add Kcl 40 meq x2 and follow up renal function and electrolytes in am.

## 2022-10-24 NOTE — Anesthesia Postprocedure Evaluation (Signed)
Anesthesia Post Note  Patient: Ebony Olson  Procedure(s) Performed: INTRAMEDULLARY (IM) NAIL INTERTROCHANTERIC (Right)     Patient location during evaluation: PACU Anesthesia Type: General Level of consciousness: sedated and patient cooperative Pain management: pain level controlled Vital Signs Assessment: post-procedure vital signs reviewed and stable Respiratory status: spontaneous breathing Cardiovascular status: stable Anesthetic complications: no   No notable events documented.  Last Vitals:  Vitals:   10/24/22 1337 10/24/22 1632  BP: (!) 92/49 (!) 97/43  Pulse: 98 93  Resp: 14 15  Temp: 36.9 C 36.9 C  SpO2: 98% 98%    Last Pain:  Vitals:   10/24/22 1632  TempSrc: Oral  PainSc:                  [name]

## 2022-10-24 NOTE — Anesthesia Preprocedure Evaluation (Addendum)
Anesthesia Evaluation  Patient identified by MRN, date of birth, ID band Patient awake    Reviewed: Allergy & Precautions, NPO status , Patient's Chart, lab work & pertinent test results  Airway Mallampati: II  TM Distance: >3 FB Neck ROM: Full    Dental no notable dental hx. (+) Dental Advisory Given   Pulmonary neg pulmonary ROS   Pulmonary exam normal breath sounds clear to auscultation       Cardiovascular hypertension, + Peripheral Vascular Disease  Normal cardiovascular exam+ dysrhythmias Supra Ventricular Tachycardia  Rhythm:Regular Rate:Normal     Neuro/Psych  PSYCHIATRIC DISORDERS Anxiety    Dementia negative neurological ROS     GI/Hepatic Neg liver ROS,GERD  ,,  Endo/Other  negative endocrine ROS    Renal/GU negative Renal ROS     Musculoskeletal  (+)  Fibromyalgia -  Abdominal   Peds  Hematology  (+) Blood dyscrasia, anemia   Anesthesia Other Findings   Reproductive/Obstetrics                             Anesthesia Physical Anesthesia Plan  ASA: 3  Anesthesia Plan: General   Post-op Pain Management: Ofirmev IV (intra-op)*   Induction: Intravenous, Rapid sequence and Cricoid pressure planned  PONV Risk Score and Plan: 4 or greater and Ondansetron, Dexamethasone and Treatment may vary due to age or medical condition  Airway Management Planned: Oral ETT  Additional Equipment:   Intra-op Plan:   Post-operative Plan: Possible Post-op intubation/ventilation  Informed Consent: I have reviewed the patients History and Physical, chart, labs and discussed the procedure including the risks, benefits and alternatives for the proposed anesthesia with the patient or authorized representative who has indicated his/her understanding and acceptance.     Dental advisory given  Plan Discussed with: CRNA  Anesthesia Plan Comments:        Anesthesia Quick Evaluation

## 2022-10-24 NOTE — H&P (Signed)
History and Physical    Patient: Ebony Olson ZOX:[id number] DOB: 07/17/1945 DOA: 10/24/2022 DOS: the patient was seen and examined on 10/24/2022 PCP: Glenis Smoker, MD  Patient coming from: Home  Chief Complaint: No chief complaint on file.  HPI: Ebony Olson is a 77 y.o. female with medical history significant of moderate dementia, left lower extremity DVT sp thrombectomy (04/2022), pulmonary embolism, hypertension, PSVT, TIA, chronic anemia and osteoporosis who presented after a mechanical fall.  Patient lives with her husband and is able to do most of her activities of daily living. She walks with a walker and has left lower extremity weakness, sp DVT.    She is anticoagulated with apixaban and she is getting frequent outpatient PRBC transfusions for persistent symptomatic anemia.   She was noted to be more week over the last few days, easy fatigability and decreased mobility. Yesterday she fell while trying to get out of the bathroom. She landed on her right side, provoking severe pain. She was not able to stand back on her feet. No loss of consciousness.  She was taken to the [facility] ED, where she was diagnosed with right hip fracture along with severe anemia, with Hgb 6,1.  She received 2 units PRBC and fresh frozen plasma, then transferred to [facility] for orthopedic procedure.  All information from her family at the bedside, she is not able to give detail history due to her cognitive impairment.   Currently patient reports pain on her right lower extremity, moderate in intensity and sharp in nature, worse with movement and improved with analgesics.  No associated headache, dyspnea or chest pain.  Per her family no hematochezia or hematuria. She has dark stools presumed to be due to oral iron supplementation.    Review of Systems: unable to review all systems due to the inability of the patient to answer questions. 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)    Right   Dysrhythmia    Fibromyalgia    GAD (generalized anxiety disorder)    High cholesterol    History of compression fracture of vertebral column    History of pulmonary embolus (PE)    Hx of blood clots    x 4 in rt leg   Hypertension    Mitral valve disorder    Mixed hyperlipidemia    Palpitations    Peripheral vascular disease (HCC)    Postmenopausal osteoporosis    Primary cardiomyopathy (HCC)    PSVT (paroxysmal supraventricular tachycardia)    Transient cerebral ischemia    Tricuspid valve disorder    Vitamin B deficiency    Past Surgical History:  Procedure Laterality Date   cyst on spine     Social History:  reports that she has never smoked. She does not have any smokeless tobacco history on file. She reports that she does not drink alcohol and does not use drugs.  Allergies  Allergen Reactions   Lunesta [Eszopiclone] Shortness Of Breath   Prednisone Anxiety   Penicillins Rash    History reviewed. No pertinent family history.  Prior to Admission medications   Medication Sig Start Date End Date Taking? Authorizing Provider  albuterol (PROVENTIL HFA;VENTOLIN HFA) 108 (90 BASE) MCG/ACT inhaler Inhale into the lungs every 6 (six) hours as needed for wheezing or shortness of breath.    [provider]  apixaban (  ELIQUIS) 5 MG TABS tablet Take 5 mg by mouth 2 (two) times daily.    [provider]  aspirin 81 MG tablet Take 81 mg by mouth daily.    [provider]  clonazePAM (KLONOPIN) 1 MG tablet Take 1 mg by mouth at bedtime.    [provider]  denosumab (PROLIA) 60 MG/ML SOLN injection Inject 60 mg into the skin every 6 (six) months. Administer in upper arm, thigh, or abdomen    [provider]  Dexlansoprazole (DEXILANT) 30 MG capsule Take 30 mg by mouth daily.    [provider]  furosemide (LASIX) 20 MG tablet Take 20  mg by mouth. 1/2 tab a day    [provider]  nortriptyline (PAMELOR) 50 MG capsule Take 100 mg by mouth at bedtime.    [provider]  polyethylene glycol-electrolytes (NULYTELY/GOLYTELY) 420 G solution Take 4,000 mLs by mouth once. 12/10/14   Setzer, Brand Males, NP  simvastatin (ZOCOR) 80 MG tablet Take 80 mg by mouth daily.    [provider]  TraMADol HCl 50 MG TBDP Take by mouth.    [provider]  vitamin B-12 (CYANOCOBALAMIN) 1000 MCG tablet Take 1,000 mcg by mouth daily. Sq Once a month    [provider]  Vitamin D, Ergocalciferol, (DRISDOL) 50000 UNITS CAPS capsule Take 50,000 Units by mouth. One every two weeks    [provider]    Physical Exam: Vitals:   10/24/22 0317 10/24/22 0400 10/24/22 0417 10/24/22 0531  BP: (!) 144/73  125/60 (!) 101/47  Pulse: (!) 132  (!) 133 (!) 119  Resp: 20  20 16   Temp: 100.3 F (37.9 C)  99.8 F (37.7 C) 97.6 F (36.4 C)  TempSrc: Oral  Oral   SpO2: 92%  96% 97%  Weight:  73.2 kg    Height:  5' 7.01" (1.702 m)     Neurology awake and alert ENT with mild pallor, no icterus, dry mucous membranes Cardiovascular with S1 and S2 present and rhythmic with no gallops, rubs or murmurs Respiratory with no rales or wheezing, no rhonchi on anterior auscultation Abdomen soft and not distended, non tender Left lower extremity non pitting edema +. Right lower extremity shortened and externally rotated.  Data Reviewed:   Na 138, K 4,0 Cl 107. Bicarbonate 25, glucose 111, bun 20, cr 1,0 Wbc 8,3 hgb 7,8 plt 230   Hip radiograph with acute intertrochanteric right femur fracture.   Assessment and Plan: * Closed right hip fracture, initial encounter (HCC) Plan to surgery today. Will plan to transfuse one unit PRBC prior to surgery to target a hgb of 8 or greater. Post procedure will give second unit PRBC. Otherwise patient is medically stable for surgical procedure.  Continue pain control with  morphine and oxycodone.  DVT prophylaxis, mechanical for now and resume apixaban when [facility] per surgical team.  Follow up with post operative PT and OT.   Symptomatic anemia Patient has received 2 units prior to transfer to [facility] with appropriate response.  Her hgb this am is 7.8, will plan to transfuse 2 more units today. Hold on aspirin.  Check iron panel. Her family is requesting a formal GI evaluation during this hospitalization for anemia workup.   Dyslipidemia Continue statin therapy.  DVT (deep venous thrombosis) (HCC) Chronic deep vein thrombosis.  Plan to resume apixaban for anticoagulation post procedure, when [facility] per surgical team. For now continue with mechanical compression devices.   Dementia (HCC) Continue  clonazepam and nortriptyline.  GERD (gastroesophageal reflux disease) Resume proton pump  inhibitor.     Advance Care Planning:   Code Status: Full Code   Consults: Orthopedics   Family Communication: I spoke with patient's daughter and son at the bedside, we talked in detail about patient's condition, plan of care and prognosis and all questions were addressed.   Severity of Illness: The appropriate patient status for this patient is INPATIENT. Inpatient status is judged to be reasonable and necessary in order to provide the required intensity of service to ensure the patient's safety. The patient's presenting symptoms, physical exam findings, and initial radiographic and laboratory data in the context of their chronic comorbidities is felt to place them at high risk for further clinical deterioration. Furthermore, it is not anticipated that the patient will be medically stable for discharge from the hospital within 2 midnights of admission.   * I certify that at the point of admission it is my clinical judgment that the patient will require inpatient hospital care spanning beyond 2 midnights from the point of admission due to high intensity of service, high risk for  further deterioration and high frequency of surveillance required.*  Author: Coralie Keens, MD 10/24/2022 9:31 AM  For on call review [email or web address].

## 2022-10-24 NOTE — Assessment & Plan Note (Addendum)
Patient has received 2 units prior to transfer to [facility] with appropriate response.  Her hgb this am is 7.8, will plan to transfuse 2 more units today. Hold on aspirin.  Check iron panel. Her family is requesting a formal GI evaluation during this hospitalization for anemia workup.

## 2022-10-24 NOTE — Assessment & Plan Note (Signed)
Chronic deep vein thrombosis.  Plan to resume apixaban for anticoagulation post procedure, when [facility] per surgical team. For now continue with mechanical compression devices.

## 2022-10-24 NOTE — Anesthesia Procedure Notes (Signed)
Procedure Name: Intubation Date/Time: 10/24/2022 10:12 AM  Performed by: Gwenyth Allegra, CRNAPre-anesthesia Checklist: Patient identified, Emergency Drugs available, Suction available, Patient being monitored and Timeout performed Patient Re-evaluated:Patient Re-evaluated prior to induction Oxygen Delivery Method: Circle system utilized Preoxygenation: Pre-oxygenation with 100% oxygen Induction Type: IV induction, Rapid sequence and Cricoid Pressure applied Laryngoscope Size: Mac and 3 Grade View: Grade I Tube type: Oral Tube size: 7.0 mm Number of attempts: 1 Airway Equipment and Method: Stylet Placement Confirmation: ETT inserted through vocal cords under direct vision, positive ETCO2 and breath sounds checked- equal and bilateral Secured at: 21 cm Tube secured with: Tape Dental Injury: Teeth and Oropharynx as per pre-operative assessment

## 2022-10-24 NOTE — Consult Note (Signed)
Orthopaedic Trauma Service (OTS) Consult   Patient ID: Ebony Olson MRN: [id number] DOB/AGE: 1945/11/14 77 y.o.  Reason for Consult: Right hip fracture Referring Physician: [facility] emergency department  HPI: Ebony Olson is an 77 y.o. female with a history of dementia as well as extensive DVT in the left lower extremity in 2023 (on Eliquis) being seen in consultation for right intertrochanteric femur fracture.  Patient fell while at home yesterday.  Had immediate pain in the right hip was unable to ambulate.  Was seen in [facility] emergency department and found to have right intertrochanteric femur fracture.  Orthopedics was consulted for evaluation and management.  Patient subsequently transferred down to [facility] for surgical intervention.  Patient admitted to the hospitalist service. Patient seen this morning on 5N.  Daughter and son are at bedside.  Patient notes pain is fairly well-controlled currently.  Denies any numbness or tingling throughout the right lower extremity.  No previous injury or surgeries to the right lower extremity.  Patient had an extensive DVT in the left lower extremity in 2023 which required removal.  She has been on Eliquis since then.  Last dose of Eliquis was Saturday morning (10/23/2022).  Patient lives at home with her husband.  Ambulates at baseline with a walker.  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  Social History:  reports that she has never smoked. She does not have any smokeless tobacco history on file. She reports that she does not drink alcohol and does not use drugs.  Allergies:  Allergies   Allergen Reactions   Lunesta [Eszopiclone] Shortness Of Breath   Prednisone Anxiety   Penicillins Rash    Medications: I have reviewed the patient's current medications. Prior to Admission:  Medications Prior to Admission  Medication Sig Dispense Refill Last Dose   albuterol (PROVENTIL HFA;VENTOLIN HFA) 108 (90 BASE) MCG/ACT inhaler Inhale into the lungs every 6 (six) hours as needed for wheezing or shortness of breath.      apixaban (ELIQUIS) 5 MG TABS tablet Take 5 mg by mouth 2 (two) times daily.      aspirin 81 MG tablet Take 81 mg by mouth daily.      clonazePAM (KLONOPIN) 1 MG tablet Take 1 mg by mouth at bedtime.      denosumab (PROLIA) 60 MG/ML SOLN injection Inject 60 mg into the skin every 6 (six) months. Administer in upper arm, thigh, or abdomen      Dexlansoprazole (DEXILANT) 30 MG capsule Take 30 mg by mouth daily.      furosemide (LASIX) 20 MG tablet Take 20 mg by mouth. 1/2 tab a day      nortriptyline (PAMELOR) 50 MG capsule Take 100 mg by mouth at bedtime.      polyethylene glycol-electrolytes (NULYTELY/GOLYTELY) 420 G solution Take 4,000 mLs by mouth once. 4000 mL 0    simvastatin (ZOCOR) 80 MG tablet Take 80 mg by mouth daily.      TraMADol HCl 50 MG TBDP Take by mouth.  vitamin B-12 (CYANOCOBALAMIN) 1000 MCG tablet Take 1,000 mcg by mouth daily. Sq Once a month      Vitamin D, Ergocalciferol, (DRISDOL) 50000 UNITS CAPS capsule Take 50,000 Units by mouth. One every two weeks       ROS: Constitutional: No fever or chills Vision: No changes in vision ENT: No difficulty swallowing CV: No chest pain Pulm: No SOB or wheezing GI: No nausea or vomiting GU: No urgency or inability to hold urine Skin: No poor wound healing Neurologic: No numbness or tingling Psychiatric: No depression or anxiety Heme: No bruising Allergic: No reaction to medications or food   Exam: Blood pressure (!) 101/47, pulse (!) 119, temperature 97.6 F (36.4 C), resp. rate 16, height  5' 7.01" (1.702 m), weight 73.2 kg, SpO2 97 %. General: Resting in bed comfortably, no acute distress Orientation: Alert and oriented to person, place, situation Mood and Affect: Mood and affect appropriate, pleasant and cooperative Gait: Not assessed due to known fracture Coordination and balance: Within normal limits  Right lower extremity: Leg slightly externally rotated.  Tender about the hip as expected.  No significant tenderness to the thigh or lower leg.  No calf tenderness.  Tolerates some gentle ankle dorsiflexion and plantarflexion.  Able to wiggle toes.  Dorsal sensation all aspects of the foot.  Neurovascularly intact  Left lower extremity: Baseline swelling throughout the extremity.  Skin without lesions. No tenderness to palpation. Full painless ROM, full strength in each muscle groups without evidence of instability.  Motor and sensory function at baseline.  Neurovascularly intact   Medical Decision Making: Data: Imaging: AP pelvis with AP and lateral views of the right hip show intertrochanteric femur fracture  Labs: No results found for this or any previous visit (from the past 24 hour(s)).   Assessment/Plan: 77 year old female with baseline dementia and history of DVT left lower extremity on Eliquis presenting with right intertrochanteric femur fracture  Patient was significant injury to right lower extremity which require surgical intervention.  Recommend proceeding with intramedullary nailing right intertrochanteric femur fracture.  Will plan to proceed with this procedure after this morning.  Risk and benefits of procedure were discussed with the patient, her daughter, and son at bedside. Risks discussed included bleeding, infection, malunion, nonunion, damage to surrounding nerves and blood vessels, pain, hardware prominence or irritation, hardware failure, stiffness, post-traumatic arthritis, DVT/PE, compartment syndrome, and even anesthesia complications.  Patient and  her family state their understanding of these risks and agreed to proceed with surgery.  Consent will be obtained.  Please keep patient n.p.o.  Thompson Caul PA-C [facility] [phone] (office) [email or web address]

## 2022-10-24 NOTE — Assessment & Plan Note (Signed)
Continue statin therapy.

## 2022-10-24 NOTE — Op Note (Signed)
Orthopaedic Surgery Operative Note (CSN: [id number] ) Date of Surgery: 10/24/2022  Admit Date: 10/24/2022   Diagnoses: Pre-Op Diagnoses: Right intertrochanteric femur fracture  Post-Op Diagnosis: Same  Procedures: CPT 27245-Cephalomedullary nailing of right intertrochanteric femur fracture   Surgeons : Primary: Roby Lofts, MD  Assistant: Ulyses Southward, PA-C  Location: OR 3   Anesthesia: General   Antibiotics: Ancef 2g preop   Tourniquet time: None    Estimated Blood Loss: 75 mL  Complications:none   Specimens:None   Implants: Implant Name Type Inv. Item Serial No. Manufacturer Lot No. LRB No. Used Action  NAIL INTERTAN 10X18 130D 10S - [id number] Nail NAIL INTERTAN 10X18 130D 10S  [employer] [id number] Right 1 Implanted  SCREW LAG COMPR KIT 105/100 - [id number] Screw SCREW LAG COMPR KIT 105/100  [employer] [id number] Right 1 Implanted  SCREW TRIGEN LOW PROF 5.0X32.5 - [id number] Screw SCREW TRIGEN LOW PROF 5.0X32.5  [employer] [id number] Right 1 Implanted     Indications for Surgery: 77 year old female who sustained a ground-level fall with a right intertrochanteric femur fracture.  Due to the unstable nature of her injury I recommend proceeding with cephalomedullary nailing of her right hip.  Risks and benefits were discussed with the patient and her family.  Risks included but not limited to bleeding, infection, malunion, nonunion, hardware failure, hardware irritation, nerve or blood vessel injury, DVT, even the possibility anesthetic complications.  They agreed to proceed with surgery and consent was obtained.  Operative Findings: 1.  Cephalomedullary nailing of right intertrochanteric femur fracture using [employer] 10 x 180 mm InterTAN with a 105/100 mm lag screw/compression screw combo  Procedure: The patient was identified in the preoperative holding area. Consent was confirmed with the patient and their  family and all questions were answered. The operative extremity was marked after confirmation with the patient. she was then brought back to the operating room by our anesthesia colleagues.  She was placed under general anesthetic and carefully transferred over to a Hana table.  All bony prominences were well-padded.  Traction was applied to the right lower extremity.  Fluoroscopic imaging showed adequate reduction.  The right lower extremity was then prepped and draped in usual sterile fashion.  Timeout was performed to verify the patient, the procedure, and the extremity.  Preoperative antibiotics were dosed.  A small incision proximal to the greater trochanter was made and carried down through skin and subcutaneous tissue.  I directed a threaded guidewire at the tip of the greater trochanter and advanced it into the proximal metaphysis.  I then used an entry reamer to enter the medullary canal.  I then placed an entry reamer into the medullary canal.  A 10 x 180 mm nail was then placed down the center of the canal.  I then used the targeting arm to direct a threaded guidewire into the head/neck segment.  I confirmed adequate tip apex distance with fluoroscopic imaging.  I then measured the length and decided to use 105 mm lag screw.  I then drilled the path for the compression screw and placed an antirotation bar.  I then drilled the path for the lag screw and then placed the lag screw.  I then placed the compression screw and compressed approximately 5 mm through the nail.  I then statically locked the proximal portion of the nail.  I then used the targeting arm to place a distal interlocking screw from lateral to medial.  The targeting arm  was then removed and final fluoroscopic imaging was obtained.  The incisions were irrigated and closed with 2-0 Monocryl and Dermabond.  Sterile dressings were applied.  The patient was then awoke from anesthesia and taken to the PACU in stable condition.  Post Op  Plan/Instructions: The patient will be weightbearing as tolerated to the right lower extremity.  She will receive postoperative Ancef.  She will be restarted on her Eliquis once her hemoglobin is stabilized.  Will have her mobilize with physical and Occupational Therapy.  I was present and performed the entire surgery.  Ulyses Southward, PA-C did assist me throughout the case. An assistant was necessary given the difficulty in approach, maintenance of reduction and ability to instrument the fracture.   Truitt Merle, MD [facility]

## 2022-10-24 NOTE — Assessment & Plan Note (Signed)
Resume proton pump  inhibitor.

## 2022-10-24 NOTE — Assessment & Plan Note (Signed)
Plan to surgery today. Will plan to transfuse one unit PRBC prior to surgery to target a hgb of 8 or greater. Post procedure will give second unit PRBC. Otherwise patient is medically stable for surgical procedure.  Continue pain control with morphine and oxycodone.  DVT prophylaxis, mechanical for now and resume apixaban when [facility] per surgical team.  Follow up with post operative PT and OT.

## 2022-10-24 NOTE — Progress Notes (Addendum)
Patient noted to be yellow MEWS upon arrival, patient has been tachycardic even with transfer facility. Notified Charge Jasmine December and Dr. Janalyn Shy, pain medicine administered.    10/24/22 0317  Assess: MEWS Score  Temp 100.3 F (37.9 C)  BP (!) 144/73  MAP (mmHg) 91  Pulse Rate (!) 132  Resp 20  SpO2 92 %  O2 Device Nasal Cannula  Assess: MEWS Score  MEWS Temp 0  MEWS Systolic 0  MEWS Pulse 3  MEWS RR 0  MEWS LOC 0  MEWS Score 3  MEWS Score Color Yellow  Assess: if the MEWS score is Yellow or Red  Were vital signs taken at a resting state? Yes  Focused Assessment No change from prior assessment  Does the patient meet 2 or more of the SIRS criteria? No  MEWS guidelines implemented  Yes, yellow  Treat  MEWS Interventions Considered administering scheduled or prn medications/treatments as ordered  Take Vital Signs  Increase Vital Sign Frequency  Yellow: Q2hr x1, continue Q4hrs until patient remains green for 12hrs  Escalate  MEWS: Escalate Yellow: Discuss with charge nurse and consider notifying provider and/or RRT  Notify: Charge Nurse/RN  Name of Charge Nurse/RN Notified Emergency planning/management officer  Provider Notification  Provider Name/Title Dr. Janalyn Shy  Date Provider Notified 10/24/22  Time Provider Notified [phone]  Method of Notification Page  Notification Reason  (yellow MEWS)  Provider response No new orders  Date of Provider Response 10/24/22  Time of Provider Response 0357  Assess: SIRS CRITERIA  SIRS Temperature  0  SIRS Pulse 1  SIRS Respirations  0  SIRS WBC 0  SIRS Score Sum  1

## 2022-10-24 NOTE — Progress Notes (Signed)
Patient brought in by [facility], status post 2 units of PRBC and 2units of FFP, per [facility] transfusion was finished at 0300. Patient is demented by baseline according to family.  Patient able to answer name and reason for admission. Admission notified.

## 2022-10-25 ENCOUNTER — Encounter (HOSPITAL_COMMUNITY): Payer: Self-pay | Admitting: Student

## 2022-10-25 DIAGNOSIS — S72001A Fracture of unspecified part of neck of right femur, initial encounter for closed fracture: Secondary | ICD-10-CM | POA: Diagnosis not present

## 2022-10-25 LAB — TYPE AND SCREEN
ABO/RH(D): O POS
Antibody Screen: POSITIVE

## 2022-10-25 LAB — BASIC METABOLIC PANEL
Anion gap: 6 (ref 5–15)
BUN: 19 mg/dL (ref 8–23)
CO2: 24 mmol/L (ref 22–32)
Calcium: 7.7 mg/dL — ABNORMAL LOW (ref 8.9–10.3)
Chloride: 107 mmol/L (ref 98–111)
Creatinine, Ser: 1.16 mg/dL — ABNORMAL HIGH (ref 0.44–1.00)
GFR, Estimated: 49 mL/min — ABNORMAL LOW (ref >60)
Glucose, Bld: 94 mg/dL (ref 70–99)
Potassium: 3.9 mmol/L (ref 3.5–5.1)
Sodium: 137 mmol/L (ref 135–145)

## 2022-10-25 LAB — CBC
HCT: 31.3 % — ABNORMAL LOW (ref 36.0–46.0)
Hemoglobin: 10.2 g/dL — ABNORMAL LOW (ref 12.0–15.0)
MCH: 31 pg (ref 26.0–34.0)
MCHC: 32.6 g/dL (ref 30.0–36.0)
MCV: 95.1 fL (ref 80.0–100.0)
Platelets: 182 10*3/uL (ref 150–400)
RBC: 3.29 MIL/uL — ABNORMAL LOW (ref 3.87–5.11)
RDW: 21.8 % — ABNORMAL HIGH (ref 11.5–15.5)
WBC: 5.8 10*3/uL (ref 4.0–10.5)
nRBC: 0 % (ref 0.0–0.2)

## 2022-10-25 LAB — IRON AND TIBC
Iron: 42 ug/dL (ref 28–170)
Saturation Ratios: 17 % (ref 10.4–31.8)
TIBC: 252 ug/dL (ref 250–450)
UIBC: 210 ug/dL

## 2022-10-25 LAB — VITAMIN D 25 HYDROXY (VIT D DEFICIENCY, FRACTURES): Vit D, 25-Hydroxy: 87.73 ng/mL (ref 30–100)

## 2022-10-25 LAB — BPAM RBC
Blood Product Expiration Date: 202407252359
ISSUE DATE / TIME: 202407071104
Unit Type and Rh: 5100

## 2022-10-25 LAB — FERRITIN: Ferritin: 145 ng/mL (ref 11–307)

## 2022-10-25 LAB — TRANSFERRIN: Transferrin: 181 mg/dL — ABNORMAL LOW (ref 192–382)

## 2022-10-25 MED ORDER — MIRTAZAPINE 15 MG PO TABS
7.5000 mg | ORAL_TABLET | Freq: Every day | ORAL | Status: DC
  Administered 2022-10-25: 7.5 mg via ORAL
  Filled 2022-10-25: qty 1

## 2022-10-25 MED ORDER — DONEPEZIL HCL 10 MG PO TABS
10.0000 mg | ORAL_TABLET | Freq: Every day | ORAL | Status: DC
  Administered 2022-10-25: 10 mg via ORAL
  Filled 2022-10-25: qty 1

## 2022-10-25 MED ORDER — APIXABAN 5 MG PO TABS
5.0000 mg | ORAL_TABLET | Freq: Two times a day (BID) | ORAL | Status: DC
  Administered 2022-10-25 – 2022-11-03 (×18): 5 mg via ORAL
  Filled 2022-10-25 (×19): qty 1

## 2022-10-25 MED ORDER — MEMANTINE HCL 10 MG PO TABS
10.0000 mg | ORAL_TABLET | Freq: Every day | ORAL | Status: AC
  Administered 2022-10-25 – 2022-10-26 (×2): 10 mg via ORAL
  Filled 2022-10-25 (×2): qty 1

## 2022-10-25 NOTE — Plan of Care (Signed)
  Problem: Elimination: Goal: Will not experience complications related to bowel motility Outcome: Not Progressing   Problem: Pain Managment: Goal: General experience of comfort will improve Outcome: Not Progressing   Problem: Safety: Goal: Ability to remain free from injury will improve Outcome: Not Progressing   

## 2022-10-25 NOTE — Progress Notes (Signed)
Fracture Care Post-op Anticoagulation Consult Note  Brief Assessment: 9 yof s/p s/p IM nail right femur fracture on 7/7. She took apixaban PTA for extensive left DVT s/p thrombectomy in 2023. Pharmacy consulted to resume apixaban if POD 1 Hgb >=9.  Hgb is 10.2 s/p 2 units PRBC for ABLA (also had 1 unit PRBC peri-operatively) and platelets are normal at 182. No overt bleeding noted. Anemia panel from this morning likely inaccurate due to recent transfusions. Ortho notes ok to resume apixaban post-op.  Plan: Resume apixaban 5 mg PO bid Pharmacy signing off but will continue to follow peripherally - please re-consult if needed  Thank you for involving pharmacy in this patient's care.  Loura Back, PharmD, BCPS Clinical Pharmacist Clinical phone for 10/25/2022 is [phone] 10/25/2022 8:49 AM

## 2022-10-25 NOTE — Evaluation (Signed)
Occupational Therapy Evaluation Patient Details Name: Ebony Olson MRN: [id number] DOB: 09-23-1945 Today's Date: 10/25/2022   History of Present Illness 77 y.o. female with medical history significant of moderate dementia, left lower extremity DVT sp thrombectomy (04/2022), pulmonary embolism, hypertension, PSVT, TIA, chronic anemia and osteoporosis who presented after a mechanical fall sustaining a closed right hip fracture. 7/7 underwent cephalomedullary nailing of her right hip.   Clinical Impression   Pt admitted with the above diagnosis. Pt currently with functional limitations due to the deficits listed below (see OT Problem List). Prior to admit, pt was living with husband at home. Recently returned home from a stay in rehab in Feb 2024. Since returning home, pt has been limited with mobility d/t left LE weakness. She was able to perform transfers and ambulate short distance only. Received assistance for BADL tasks.  Pt will benefit from acute skilled OT to increase their safety and independence with ADL and functional mobility for ADL to facilitate discharge. Patient will benefit from continued inpatient follow up therapy, <3 hours/day. OT will continue to follow patient acutely.         Recommendations for follow up therapy are one component of a multi-disciplinary discharge planning process, led by the attending physician.  Recommendations may be updated based on patient status, additional functional criteria and insurance authorization.   Assistance Recommended at Discharge Frequent or constant Supervision/Assistance  Patient can return home with the following Two people to help with walking and/or transfers;Two people to help with bathing/dressing/bathroom;Help with stairs or ramp for entrance;Assist for transportation    Functional Status Assessment  Patient has had a recent decline in their functional status and demonstrates the ability to make significant improvements in function  in a reasonable and predictable amount of time.  Equipment Recommendations  Other (comment) (defer to next venue of care)       Precautions / Restrictions Precautions Precautions: Fall Precaution Comments: baseline LLE weakness. dementia Restrictions Weight Bearing Restrictions: No RLE Weight Bearing: Weight bearing as tolerated      Mobility Bed Mobility Overal bed mobility: Needs Assistance Bed Mobility: Supine to Sit     Supine to sit: Total assist, +2 for safety/equipment, +2 for physical assistance, HOB elevated     General bed mobility comments: VC provided for hand placement and use of bed rail to assist with bed mobility. Pt unable to assist with bed mobility d/t fear and anticipation of pain upon any movement/mobility. Pt required 2 person assist to complete helicopter method using bed pad. Patient Response: Anxious  Transfers Overall transfer level: Needs assistance Equipment used: Rolling walker (2 wheels), None Transfers: Sit to/from Stand, Bed to chair/wheelchair/BSC Sit to Stand: Total assist, +2 physical assistance, +2 safety/equipment, From elevated surface Stand pivot transfers: Total assist, +2 physical assistance, +2 safety/equipment, From elevated surface         General transfer comment: Initially pt completed sit to stand with 2 person face to face technique utilizing RW. VC and total assist for hand placement and set-up prior to standing. Bed pad used to help lift hips off bed. Pt stood at EOB for ~90 seconds before sitting back down. Pt verbalized that she wished to get into the recliner although unable to move/advance either foot to transfer when provided VC. Second attempt, RW was not utilized and 2 person face to face technique was completed to complete stand pivot towards recliner. Maxi sky lift pad placed underneath pt.      Balance Overall balance assessment: Needs  assistance Sitting-balance support: Bilateral upper extremity supported, Feet  supported Sitting balance-Leahy Scale: Poor Sitting balance - Comments: sitting EOB Postural control: Posterior lean Standing balance support: Bilateral upper extremity supported, During functional activity, Reliant on assistive device for balance Standing balance-Leahy Scale: Zero Standing balance comment: posterior lean present during standing with RW       ADL either performed or assessed with clinical judgement   ADL    General ADL Comments: Pt requires total assist for all BADL tasks at this time. Bed level ADL of 2 person is required.     Vision Baseline Vision/History: 0 No visual deficits Ability to See in Adequate Light: 0 Adequate Patient Visual Report: No change from baseline Vision Assessment?: No apparent visual deficits            Pertinent Vitals/Pain Pain Assessment Pain Assessment: Faces Faces Pain Scale: Hurts worst Pain Location: right hip during any movement and mobility Pain Descriptors / Indicators: Grimacing, Guarding, Other (Comment), Aching, Moaning (yelling) Pain Intervention(s): Monitored during session, Premedicated before session, Ice applied, Repositioned, Limited activity within patient's tolerance, Relaxation     Hand Dominance Right   Extremity/Trunk Assessment Upper Extremity Assessment Upper Extremity Assessment: Overall WFL for tasks assessed   Lower Extremity Assessment Lower Extremity Assessment: Generalized weakness;Defer to PT evaluation   Cervical / Trunk Assessment Cervical / Trunk Assessment: Other exceptions Cervical / Trunk Exceptions: posterior pelvic tilt   Communication Communication Communication: No difficulties   Cognition Arousal/Alertness: Awake/alert Behavior During Therapy: Anxious Overall Cognitive Status: History of cognitive impairments - at baseline                  General Comments: dementia. Oriented to self and situation. Daughter, Toniann Fail present to provide background information and confirm  accuracy of history provided by patient.     General Comments  SpO2 initially at 88% on 1L O2. Increased to 2L during session and SpO2 increased to 90%. HR elevated to 114 at the highest during activity.            Home Living Family/patient expects to be discharged to:: Skilled nursing facility Living Arrangements: Spouse/significant other Available Help at Discharge:  (Husband had recent knee surgery 3 weeks ago. Unable to assist) Type of Home: House Home Access: Stairs to enter [employer] of Steps: 1 Entrance Stairs-Rails: Left Home Layout: One level     Bathroom Shower/Tub: Producer, television/film/video: Standard (with rised toilet seat)     Home Equipment: Rolling Walker (2 wheels);Shower seat - built in;Toilet riser;Hospital bed;Wheelchair - manual   Additional Comments: with air pressure mattress, lift chair. Pt was in rehab following a 3 foot long DVT in left leg. Returned home in February 2024.      Prior Functioning/Environment               Mobility Comments: limited with left leg mobility and strength d/t recent DVT. ADLs Comments: receives assistance with dressing and bathing.        OT Problem List: Decreased strength;Pain;Decreased activity tolerance;Decreased safety awareness;Decreased knowledge of use of DME or AE;Impaired balance (sitting and/or standing);Decreased knowledge of precautions      OT Treatment/Interventions: Self-care/ADL training;Therapeutic exercise;Therapeutic activities;Energy conservation;DME and/or AE instruction;Patient/family education;Balance training;Manual therapy;Modalities    OT Goals(Current goals can be found in the care plan section) Acute Rehab OT Goals Patient Stated Goal: to get into recliner OT Goal Formulation: Patient unable to participate in goal setting Time For Goal Achievement: 11/08/22 Potential to Achieve  Goals: Fair  OT Frequency: Min 2X/week    Co-evaluation PT/OT/SLP  Co-Evaluation/Treatment: Yes Reason for Co-Treatment: To address functional/ADL transfers;For patient/therapist safety;Necessary to address cognition/behavior during functional activity   OT goals addressed during session: ADL's and self-care;Strengthening/ROM;Proper use of Adaptive equipment and DME      AM-PAC OT "6 Clicks" Daily Activity     Outcome Measure Help from another person eating meals?: A Little Help from another person taking care of personal grooming?: Total Help from another person toileting, which includes using toliet, bedpan, or urinal?: Total Help from another person bathing (including washing, rinsing, drying)?: Total Help from another person to put on and taking off regular upper body clothing?: Total Help from another person to put on and taking off regular lower body clothing?: Total 6 Click Score: 8   End of Session Equipment Utilized During Treatment: Rolling walker (2 wheels);Gait belt;Oxygen Nurse Communication: Mobility status;Need for lift equipment  Activity Tolerance: Patient limited by pain Patient left: in chair;with call bell/phone within reach;with chair alarm set;with family/visitor present  OT Visit Diagnosis: Unsteadiness on feet (R26.81);Muscle weakness (generalized) (M62.81);History of falling (Z91.81);Pain Pain - Right/Left: Right Pain - part of body: Hip                Time: 8657-8469 OT Time Calculation (min): 36 min Charges:  OT General Charges $OT Visit: 1 Visit OT Evaluation $OT Eval High Complexity: 1 High  [employer], OTR/L,CBIS  Supplemental OT - [facility] and [facility] Secure Chat Preferred    Staley Budzinski, Charisse March 10/25/2022, 12:02 PM

## 2022-10-25 NOTE — Progress Notes (Addendum)
       Overnight   NAME: NICOLL TONCHE MRN: [id number] DOB : [birth year]    Date of Service   10/25/2022   HPI/Events of Note    Notified by RN for inquiry of nighttime meds by RN and family  Confirmed by family that patient does take : Namenda 10mg , daily  Remeron 7.5mg   daily and  Aricept 10 mg  daily  Patient does Not take : Clonazepam and/or  Nortriptyline    Interventions/ Plan   Reorder verified       Chinita Greenland BSN MSNA MSN ACNPC-AG Acute Care Nurse Practitioner [facility]

## 2022-10-25 NOTE — Evaluation (Signed)
Physical Therapy Evaluation Patient Details Name: Ebony Olson MRN: [id number] DOB: Feb 22, 1946 Today's Date: 10/25/2022  History of Present Illness  77 y.o. female with medical history significant of moderate dementia, left lower extremity DVT sp thrombectomy (04/2022), pulmonary embolism, hypertension, PSVT, TIA, chronic anemia and osteoporosis who presented after a mechanical fall sustaining a closed right hip fracture. 7/7 underwent cephalomedullary nailing of her right hip.  Clinical Impression  Pt presents with admitting diagnosis above. Co treat with OT. Pt currently requires +2 Total A for all mobility and demonstrates a very high fear of falling. Recommend SNF upon DC. PT will continue to follow.        Assistance Recommended at Discharge Frequent or constant Supervision/Assistance  If plan is discharge home, recommend the following:  Can travel by private vehicle  Two people to help with walking and/or transfers;A lot of help with bathing/dressing/bathroom;Assistance with cooking/housework;Direct supervision/assist for medications management;Direct supervision/assist for financial management;Assist for transportation;Help with stairs or ramp for entrance   No    Equipment Recommendations Other (comment) (Per accepting facility)  Recommendations for Other Services       Functional Status Assessment Patient has had a recent decline in their functional status and demonstrates the ability to make significant improvements in function in a reasonable and predictable amount of time.     Precautions / Restrictions Precautions Precautions: Fall Precaution Comments: baseline LLE weakness. dementia Restrictions Weight Bearing Restrictions: Yes RLE Weight Bearing: Weight bearing as tolerated      Mobility  Bed Mobility Overal bed mobility: Needs Assistance Bed Mobility: Supine to Sit     Supine to sit: Total assist, +2 for safety/equipment, +2 for physical assistance, HOB  elevated     General bed mobility comments: VC provided for hand placement and use of bed rail to assist with bed mobility. Pt unable to assist with bed mobility d/t fear and anticipation of pain upon any movement/mobility. Pt required 2 person assist to complete helicopter method using bed pad. Patient Response: Anxious  Transfers Overall transfer level: Needs assistance Equipment used: Rolling walker (2 wheels), None Transfers: Sit to/from Stand, Bed to chair/wheelchair/BSC Sit to Stand: Total assist, +2 physical assistance, +2 safety/equipment, From elevated surface Stand pivot transfers: Total assist, +2 physical assistance, +2 safety/equipment, From elevated surface         General transfer comment: Initially pt completed sit to stand with 2 person face to face technique utilizing RW. VC and total assist for hand placement and set-up prior to standing. Bed pad used to help lift hips off bed. Pt stood at EOB for ~90 seconds before sitting back down. Pt verbalized that she wished to get into the recliner although unable to move/advance either foot to transfer when provided VC. Second attempt, RW was not utilized and 2 person face to face technique was completed to complete stand pivot towards recliner. Maxi sky lift pad placed underneath pt.    Ambulation/Gait               General Gait Details: Pt unable  Stairs            Wheelchair Mobility     Tilt Bed Tilt Bed Patient Response: Anxious  Modified Rankin (Stroke Patients Only)       Balance Overall balance assessment: Needs assistance Sitting-balance support: Bilateral upper extremity supported, Feet supported Sitting balance-Leahy Scale: Poor Sitting balance - Comments: sitting EOB Postural control: Posterior lean Standing balance support: Bilateral upper extremity supported, During functional activity, Reliant on  assistive device for balance Standing balance-Leahy Scale: Zero Standing balance comment:  posterior lean present during standing with RW                             Pertinent Vitals/Pain Pain Assessment Pain Assessment: Faces Faces Pain Scale: Hurts worst Pain Location: right hip during any movement and mobility Pain Descriptors / Indicators: Grimacing, Guarding, Other (Comment), Aching, Moaning (yelling) Pain Intervention(s): Monitored during session, Limited activity within patient's tolerance, Repositioned, Ice applied    Home Living Family/patient expects to be discharged to:: Skilled nursing facility Living Arrangements: Spouse/significant other Available Help at Discharge:  (Husband had recent knee surgery 3 weeks ago. Unable to assist) Type of Home: House Home Access: Stairs to enter Entrance Stairs-Rails: Left Entrance Stairs-Number of Steps: 1   Home Layout: One level Home Equipment: Agricultural consultant (2 wheels);Shower seat - built in;Toilet riser;Hospital bed;Wheelchair - manual Additional Comments: with air pressure mattress, lift chair. Pt was in rehab following a 3 foot long DVT in left leg. Returned home in February 2024.    Prior Function Prior Level of Function : Needs assist             Mobility Comments: limited with left leg mobility and strength d/t recent DVT. ADLs Comments: receives assistance with dressing and bathing.     Hand Dominance   Dominant Hand: Right    Extremity/Trunk Assessment   Upper Extremity Assessment Upper Extremity Assessment: Overall WFL for tasks assessed    Lower Extremity Assessment Lower Extremity Assessment: RLE deficits/detail RLE Deficits / Details: Hip fx    Cervical / Trunk Assessment Cervical / Trunk Assessment: Other exceptions Cervical / Trunk Exceptions: posterior pelvic tilt  Communication   Communication: No difficulties  Cognition Arousal/Alertness: Awake/alert Behavior During Therapy: Anxious Overall Cognitive Status: History of cognitive impairments - at baseline                                  General Comments: dementia. Oriented to self and situation. Daughter, Toniann Fail present to provide background information and confirm accuracy of history provided by patient.        General Comments General comments (skin integrity, edema, etc.): SpO2 initially at 88% on 1L O2. Increased to 2L during session and SpO2 increased to 90%. HR elevated to 114 at the highest during activity.    Exercises     Assessment/Plan    PT Assessment Patient needs continued PT services  PT Problem List Decreased strength;Decreased range of motion;Decreased activity tolerance;Decreased mobility;Decreased balance;Decreased coordination;Decreased cognition;Decreased knowledge of use of DME;Decreased knowledge of precautions;Cardiopulmonary status limiting activity;Pain       PT Treatment Interventions DME instruction;Gait training;Stair training;Functional mobility training;Therapeutic activities;Therapeutic exercise;Balance training;Neuromuscular re-education;Patient/family education    PT Goals (Current goals can be found in the Care Plan section)  Acute Rehab PT Goals Patient Stated Goal: to go home PT Goal Formulation: With patient Time For Goal Achievement: 11/08/22 Potential to Achieve Goals: Poor    Frequency Min 3X/week     Co-evaluation PT/OT/SLP Co-Evaluation/Treatment: Yes Reason for Co-Treatment: To address functional/ADL transfers;For patient/therapist safety;Necessary to address cognition/behavior during functional activity PT goals addressed during session: Mobility/safety with mobility;Proper use of DME OT goals addressed during session: ADL's and self-care;Strengthening/ROM;Proper use of Adaptive equipment and DME       AM-PAC PT "6 Clicks" Mobility  Outcome Measure Help needed turning from your  back to your side while in a flat bed without using bedrails?: Total Help needed moving from lying on your back to sitting on the side of a flat bed without  using bedrails?: Total Help needed moving to and from a bed to a chair (including a wheelchair)?: Total Help needed standing up from a chair using your arms (e.g., wheelchair or bedside chair)?: Total Help needed to walk in hospital room?: Total Help needed climbing 3-5 steps with a railing? : Total 6 Click Score: 6    End of Session Equipment Utilized During Treatment: Gait belt Activity Tolerance: Patient limited by pain;Other (comment) (Anxiety/fear of falling) Patient left: in chair;with call bell/phone within reach;with chair alarm set Nurse Communication: Mobility status PT Visit Diagnosis: Other abnormalities of gait and mobility (R26.89)    Time: 4782-9562 PT Time Calculation (min) (ACUTE ONLY): 33 min   Charges:   PT Evaluation $PT Eval High Complexity: 1 High   PT General Charges $$ ACUTE PT VISIT: 1 Visit         Shela Nevin, PT, DPT Acute Rehab Services [phone]   Gladys Damme 10/25/2022, 4:27 PM

## 2022-10-25 NOTE — Progress Notes (Signed)
  Progress Note   Patient: Ebony Olson ZOX:[id number] DOB: 06/24/1945 DOA: 10/24/2022     1 DOS: the patient was seen and examined on 10/25/2022   Brief hospital course: Ebony Olson is a 77 y.o. female with medical history significant of moderate dementia, left lower extremity DVT sp thrombectomy (04/2022), pulmonary embolism, hypertension, PSVT, TIA, chronic anemia and osteoporosis who presented after a mechanical fall.  Right leg intertrochanteric fracture, status post cephalomedullary nail fixation.  Patient has symptomatic anemia, was on Eliquis which is held.  Status post transfusion of 2 units.  Hemoglobin is in the range of 10.  Continue to monitor closely.  Patient working with physical therapy will most likely need rehab.  Assessment and Plan: * Closed right hip fracture, initial encounter (HCC) Plan to surgery today. Will plan to transfuse one unit PRBC prior to surgery to target a hgb of 8 or greater. Post procedure will give second unit PRBC. Repeat hemoglobin is 10.0 Patient underwent cephalomedullary nail fixation today's postoperative day 1 Continue pain control with morphine and oxycodone.  DVT prophylaxis, mechanical for now and resume apixaban when [facility] per surgical team.  Follow up with post operative PT and OT.   Hypoxia :  Etiology not clear - Patient is currently on 2 L of oxygen -Check chest x-ray, incentive spirometry initiated.  Symptomatic anemia Patient has received 2 units prior to transfer to [facility] with appropriate response.  Her hgb this am is 7.8, will plan to transfuse 2 more units today. Hold on aspirin.  Check iron panel. Her family is requesting a formal GI evaluation during this hospitalization for anemia workup.  Repeat hemoglobin today is 10.2 Stool for occult blood ordered.  If positive will consult GI.  Dyslipidemia Continue statin therapy.  DVT (deep venous thrombosis) (HCC) Chronic deep vein thrombosis.  Plan to resume apixaban for  anticoagulation post procedure, when [facility] per surgical team. For now continue with mechanical compression devices.   Dementia (HCC) Continue clonazepam and nortriptyline.  GERD (gastroesophageal reflux disease) Resume proton pump  inhibitor.         Subjective: Patient seen and examined bedside today.  Patient reports surgical pain is better but she feels very fatigued.  Patient is also on oxygen support does complain of having some mild shortness of breath.  Physical Exam: Vitals:   10/24/22 2028 10/25/22 0211 10/25/22 0442 10/25/22 0805  BP:  97/62 (!) 90/59 97/60  Pulse:  92 90 99  Resp: 20  18 19   Temp: 98.4 F (36.9 C)  98.2 F (36.8 C) 98.8 F (37.1 C)  TempSrc: Oral  Oral Oral  SpO2:  95% 96% 97%  Weight:      Height:       Neurology awake and alert ENT with mild pallor, no icterus, dry mucous membranes Cardiovascular with S1 and S2 present and rhythmic with no gallops, rubs or murmurs Respiratory with no rales or wheezing, no rhonchi on anterior auscultation Abdomen soft and not distended, non tender Left lower extremity non pitting edema +. Right lower extremity shortened and externally rotated.  Data Reviewed:  Reviewed hemoglobin today is 10.0  Family Communication: Spoke to patient's daughter at bedside.  Disposition: Status is: Inpatient Remains inpatient appropriate because: Status post cephalomedullary nail fixation, hypoxia  Planned Discharge Destination: Skilled nursing facility    Time spent: 35 minutes  Author: Harold Hedge, MD 10/25/2022 2:39 PM  For on call review [email or web address].

## 2022-10-25 NOTE — Progress Notes (Signed)
Orthopaedic Trauma Progress Note  SUBJECTIVE: Doing ok this morning. Reports intermittent, moderate pain about operative site, worse with movement. No chest pain. No SOB. No nausea/vomiting. No other complaints.  Has not been up out of bed yet since surgery.  Denies any numbness or tingling throughout the right lower extremity.  Daughter at bedside.  Family interested in SNF at discharge  OBJECTIVE:  Vitals:   10/25/22 0442 10/25/22 0805  BP: (!) 90/59 97/60  Pulse: 90 99  Resp: 18 19  Temp: 98.2 F (36.8 C) 98.8 F (37.1 C)  SpO2: 96% 97%    General: Sitting up in bed, no acute distress Respiratory: No increased work of breathing.  Right lower extremity: Dressings clean, dry, intact.  Tenderness over the hip as expected.  No significant thigh tenderness.  Does have some diffuse calf tenderness.  Ankle DF/PF intact.  Able to wiggle toes.  Endorses sensation all aspects of the foot.  Neurovascularly intact.  IMAGING: Stable post op imaging.   LABS:  Results for orders placed or performed during the hospital encounter of 10/24/22 (from the past 24 hour(s))  Prepare RBC (crossmatch)     Status: None   Collection Time: 10/24/22  8:58 AM  Result Value Ref Range   Order Confirmation      ORDER PROCESSED BY BLOOD BANK Performed at [facility], [address] [place], [place] [address]   Surgical pcr screen     Status: None   Collection Time: 10/24/22  9:08 AM   Specimen: Nasal Mucosa; Nasal Swab  Result Value Ref Range   MRSA, PCR NEGATIVE NEGATIVE   Staphylococcus aureus NEGATIVE NEGATIVE  Type and screen [facility]     Status: None (Preliminary result)   Collection Time: 10/24/22  9:30 AM  Result Value Ref Range   ABO/RH(D) O POS    Antibody Screen POS    Sample Expiration 10/27/2022,2359    Antibody Identification NO CLINICALLY SIGNIFICANT ANTIBODY IDENTIFIED    Unit Number [id number]    Blood Component Type RED CELLS,LR    Unit division 00     Status of Unit ISSUED    Transfusion Status OK TO TRANSFUSE    Crossmatch Result COMPATIBLE    Unit Number [id number]    Blood Component Type RED CELLS,LR    Unit division 00    Status of Unit ISSUED    Transfusion Status OK TO TRANSFUSE    Crossmatch Result COMPATIBLE   Prepare RBC (crossmatch)     Status: None   Collection Time: 10/24/22  9:51 AM  Result Value Ref Range   Order Confirmation      ORDER PROCESSED BY BLOOD BANK Performed at [facility], [address], [place], [place] [address]   Basic metabolic panel     Status: Abnormal   Collection Time: 10/25/22  4:32 AM  Result Value Ref Range   Sodium 137 135 - 145 mmol/L   Potassium 3.9 3.5 - 5.1 mmol/L   Chloride 107 98 - 111 mmol/L   CO2 24 22 - 32 mmol/L   Glucose, Bld 94 70 - 99 mg/dL   BUN 19 8 - 23 mg/dL   Creatinine, Ser 6.29 (H) 0.44 - 1.00 mg/dL   Calcium 7.7 (L) 8.9 - 10.3 mg/dL   GFR, Estimated 49 (L) >60 mL/min   Anion gap 6 5 - 15  CBC     Status: Abnormal   Collection Time: 10/25/22  4:32 AM  Result  Value Ref Range   WBC 5.8 4.0 - 10.5 K/uL   RBC 3.29 (L) 3.87 - 5.11 MIL/uL   Hemoglobin 10.2 (L) 12.0 - 15.0 g/dL   HCT 16.1 (L) 09.6 - 04.5 %   MCV 95.1 80.0 - 100.0 fL   MCH 31.0 26.0 - 34.0 pg   MCHC 32.6 30.0 - 36.0 g/dL   RDW 40.9 (H) 81.1 - 91.4 %   Platelets 182 150 - 400 K/uL   nRBC 0.0 0.0 - 0.2 %  Iron and TIBC     Status: None   Collection Time: 10/25/22  4:32 AM  Result Value Ref Range   Iron 42 28 - 170 ug/dL   TIBC 782 956 - 213 ug/dL   Saturation Ratios 17 10.4 - 31.8 %   UIBC 210 ug/dL  Transferrin     Status: Abnormal   Collection Time: 10/25/22  4:32 AM  Result Value Ref Range   Transferrin 181 (L) 192 - 382 mg/dL  Ferritin     Status: None   Collection Time: 10/25/22  4:32 AM  Result Value Ref Range   Ferritin 145 11 - 307 ng/mL  VITAMIN D 25 Hydroxy (Vit-D Deficiency, Fractures)     Status: None   Collection Time: 10/25/22  4:32 AM  Result Value Ref Range   Vit  D, 25-Hydroxy 87.73 30 - 100 ng/mL    ASSESSMENT: Ebony Olson is a 77 y.o. female, 1 Day Post-Op s/p INTRAMEDULLARY NAIL RIGHT INTERTROCHANTERIC FEMUR FRACTURE   CV/Blood loss: Acute blood loss anemia, Hgb 10.2 this morning.  Received 1 unit perioperatively yesterday 10/24/2022.  Also received 2 units at outside hospital prior to transfer.  BP soft this morning  PLAN: Weightbearing: WBAT RLE ROM: Okay for unrestricted hip and knee motion as tolerated Incisional and dressing care: Reinforce dressings as needed  Showering: Okay to being getting incisions wet 10/27/2022 Orthopedic device(s): None  Pain management:  1. Tylenol 650 mg q 6 hours PRN 2. Robaxin 500 mg q 6 hours PRN 3.  Percocet 5-325 mg q 6 hours PRN 4. Morphine 0.5-1 mg q 3 hours PRN VTE prophylaxis:  Okay to restart Eliquis today from orthopedic standpoint , SCDs ID:  Ancef 2gm post op Foley/Lines:  No foley, KVO IVFs Impediments to Fracture Healing: Vitamin D level 87, no additional supplementation needed Dispo: PT/OT evaluation today, family interested in SNF.  TOC following for placement.   D/C recommendations: -Percocet for pain control -Home dose Eliquis for DVT prophylaxis -No additional need for Vit D supplementation  Follow - up plan: 2 weeks after discharge for wound check and repeat x-rays   Contact information:  Truitt Merle MD, Thyra Breed PA-C. After hours and holidays please check [email or web address] for group call information for [facility]   Thompson Caul, PA-C [phone] (office) [email or web address]

## 2022-10-26 DIAGNOSIS — S72001A Fracture of unspecified part of neck of right femur, initial encounter for closed fracture: Secondary | ICD-10-CM | POA: Diagnosis not present

## 2022-10-26 LAB — COMPREHENSIVE METABOLIC PANEL
ALT: 7 U/L (ref 0–44)
AST: 26 U/L (ref 15–41)
Albumin: 2.2 g/dL — ABNORMAL LOW (ref 3.5–5.0)
Alkaline Phosphatase: 143 U/L — ABNORMAL HIGH (ref 38–126)
Anion gap: 9 (ref 5–15)
BUN: 16 mg/dL (ref 8–23)
CO2: 20 mmol/L — ABNORMAL LOW (ref 22–32)
Calcium: 8.1 mg/dL — ABNORMAL LOW (ref 8.9–10.3)
Chloride: 105 mmol/L (ref 98–111)
Creatinine, Ser: 1.05 mg/dL — ABNORMAL HIGH (ref 0.44–1.00)
GFR, Estimated: 55 mL/min — ABNORMAL LOW (ref >60)
Glucose, Bld: 92 mg/dL (ref 70–99)
Potassium: 4.2 mmol/L (ref 3.5–5.1)
Sodium: 134 mmol/L — ABNORMAL LOW (ref 135–145)
Total Bilirubin: 2.2 mg/dL — ABNORMAL HIGH (ref 0.3–1.2)
Total Protein: 5.6 g/dL — ABNORMAL LOW (ref 6.5–8.1)

## 2022-10-26 LAB — CBC WITH DIFFERENTIAL/PLATELET
Abs Immature Granulocytes: 0.09 10*3/uL — ABNORMAL HIGH (ref 0.00–0.07)
Basophils Absolute: 0 10*3/uL (ref 0.0–0.1)
Basophils Relative: 1 %
Eosinophils Absolute: 0.2 10*3/uL (ref 0.0–0.5)
Eosinophils Relative: 2 %
HCT: 37.8 % (ref 36.0–46.0)
Hemoglobin: 12.4 g/dL (ref 12.0–15.0)
Immature Granulocytes: 1 %
Lymphocytes Relative: 9 %
Lymphs Abs: 0.7 10*3/uL (ref 0.7–4.0)
MCH: 32 pg (ref 26.0–34.0)
MCHC: 32.8 g/dL (ref 30.0–36.0)
MCV: 97.7 fL (ref 80.0–100.0)
Monocytes Absolute: 1 10*3/uL (ref 0.1–1.0)
Monocytes Relative: 12 %
Neutro Abs: 6.4 10*3/uL (ref 1.7–7.7)
Neutrophils Relative %: 75 %
Platelets: 185 10*3/uL (ref 150–400)
RBC: 3.87 MIL/uL (ref 3.87–5.11)
RDW: 21.5 % — ABNORMAL HIGH (ref 11.5–15.5)
WBC: 8.5 10*3/uL (ref 4.0–10.5)
nRBC: 0 % (ref 0.0–0.2)

## 2022-10-26 MED ORDER — DONEPEZIL HCL 10 MG PO TABS
10.0000 mg | ORAL_TABLET | Freq: Every day | ORAL | Status: DC
  Administered 2022-10-26 – 2022-11-02 (×8): 10 mg via ORAL
  Filled 2022-10-26 (×8): qty 1

## 2022-10-26 MED ORDER — METHOCARBAMOL 500 MG PO TABS: 500.0000 mg | ORAL_TABLET | Freq: Three times a day (TID) | ORAL | 0 refills | Status: AC | PRN

## 2022-10-26 MED ORDER — MEMANTINE HCL 10 MG PO TABS
10.0000 mg | ORAL_TABLET | Freq: Every day | ORAL | Status: DC
  Administered 2022-10-27 – 2022-11-03 (×8): 10 mg via ORAL
  Filled 2022-10-26 (×8): qty 1

## 2022-10-26 MED ORDER — ACETAMINOPHEN 325 MG PO TABS: 650.0000 mg | ORAL_TABLET | Freq: Four times a day (QID) | ORAL | Status: AC | PRN

## 2022-10-26 MED ORDER — OXYCODONE-ACETAMINOPHEN 5-325 MG PO TABS: 1.0000 | ORAL_TABLET | Freq: Four times a day (QID) | ORAL | 0 refills | Status: AC | PRN

## 2022-10-26 MED ORDER — MIRTAZAPINE 15 MG PO TABS
7.5000 mg | ORAL_TABLET | Freq: Every day | ORAL | Status: DC
  Administered 2022-10-26 – 2022-11-02 (×8): 7.5 mg via ORAL
  Filled 2022-10-26 (×8): qty 1

## 2022-10-26 NOTE — Progress Notes (Signed)
Orthopaedic Trauma Progress Note  SUBJECTIVE: Doing well today. Pain controlled at rest, increases with movement. Was able to get to bedside chair with therapies yesterday. No chest pain. No SOB. No nausea/vomiting. No other complaints. Denies any numbness or tingling throughout the right lower extremity.  Daughter at bedside.  Family interested in SNF at discharge  OBJECTIVE:  Vitals:   10/26/22 0432 10/26/22 0739  BP: (!) 107/53 (!) 107/46  Pulse: (!) 104 100  Resp: 18 16  Temp:  98.5 F (36.9 C)  SpO2: 90% 91%    General: Sitting up in bed, no acute distress Respiratory: No increased work of breathing.  Right lower extremity: Dressings removed, incisions clean, dry, intact.  Tenderness over the hip as expected.  No significant thigh tenderness.  Does have some diffuse calf tenderness.  Ankle DF/PF intact.  Able to wiggle toes.  Endorses sensation all aspects of the foot.  Neurovascularly intact.  IMAGING: Stable post op imaging.   LABS:  Results for orders placed or performed during the hospital encounter of 10/24/22 (from the past 24 hour(s))  CBC with Differential/Platelet     Status: Abnormal (Preliminary result)   Collection Time: 10/26/22  7:27 AM  Result Value Ref Range   WBC 8.5 4.0 - 10.5 K/uL   RBC 3.87 3.87 - 5.11 MIL/uL   Hemoglobin 12.4 12.0 - 15.0 g/dL   HCT 16.1 09.6 - 04.5 %   MCV 97.7 80.0 - 100.0 fL   MCH 32.0 26.0 - 34.0 pg   MCHC 32.8 30.0 - 36.0 g/dL   RDW 40.9 (H) 81.1 - 91.4 %   Platelets 185 150 - 400 K/uL   nRBC 0.0 0.0 - 0.2 %   Neutrophils Relative % PENDING %   Neutro Abs PENDING 1.7 - 7.7 K/uL   Band Neutrophils PENDING %   Lymphocytes Relative PENDING %   Lymphs Abs PENDING 0.7 - 4.0 K/uL   Monocytes Relative PENDING %   Monocytes Absolute PENDING 0.1 - 1.0 K/uL   Eosinophils Relative PENDING %   Eosinophils Absolute PENDING 0.0 - 0.5 K/uL   Basophils Relative PENDING %   Basophils Absolute PENDING 0.0 - 0.1 K/uL   WBC Morphology PENDING     RBC Morphology PENDING    Smear Review PENDING    Other PENDING %   nRBC PENDING 0 /100 WBC   Metamyelocytes Relative PENDING %   Myelocytes PENDING %   Promyelocytes Relative PENDING %   Blasts PENDING %   Immature Granulocytes PENDING %   Abs Immature Granulocytes PENDING 0.00 - 0.07 K/uL  Comprehensive metabolic panel     Status: Abnormal   Collection Time: 10/26/22  7:27 AM  Result Value Ref Range   Sodium 134 (L) 135 - 145 mmol/L   Potassium 4.2 3.5 - 5.1 mmol/L   Chloride 105 98 - 111 mmol/L   CO2 20 (L) 22 - 32 mmol/L   Glucose, Bld 92 70 - 99 mg/dL   BUN 16 8 - 23 mg/dL   Creatinine, Ser 7.82 (H) 0.44 - 1.00 mg/dL   Calcium 8.1 (L) 8.9 - 10.3 mg/dL   Total Protein 5.6 (L) 6.5 - 8.1 g/dL   Albumin 2.2 (L) 3.5 - 5.0 g/dL   AST 26 15 - 41 U/L   ALT 7 0 - 44 U/L   Alkaline Phosphatase 143 (H) 38 - 126 U/L   Total Bilirubin 2.2 (H) 0.3 - 1.2 mg/dL   GFR, Estimated 55 (L) >60 mL/min  Anion gap 9 5 - 15    ASSESSMENT: Ebony Olson is a 77 y.o. female, 2 Days Post-Op s/p INTRAMEDULLARY NAIL RIGHT INTERTROCHANTERIC FEMUR FRACTURE   CV/Blood loss: Acute blood loss anemia, Hgb 12.4 this morning.   PLAN: Weightbearing: WBAT RLE ROM: Okay for unrestricted hip and knee motion as tolerated Incisional and dressing care: leave open to air Showering: Okay to being getting incisions wet 10/27/2022 Orthopedic device(s): None  Pain management:  1. Tylenol 650 mg q 6 hours PRN 2. Robaxin 500 mg q 6 hours PRN 3.  Percocet 5-325 mg q 6 hours PRN 4. Morphine 0.5-1 mg q 3 hours PRN VTE prophylaxis:  Eliquis , SCDs ID:  Ancef 2gm post op Foley/Lines:  No foley, KVO IVFs Impediments to Fracture Healing: Vitamin D level 87, no additional supplementation needed Dispo: PT/OT evaluation ongoing, family interested in SNF.  Will get TOC involved for placement.   D/C recommendations: -Percocet and Robaxin for pain control -Home dose Eliquis for DVT prophylaxis -No additional need for  Vit D supplementation  Follow - up plan: 2 weeks after discharge for wound check and repeat x-rays   Contact information:  Truitt Merle MD, Thyra Breed PA-C. After hours and holidays please check [email or web address] for group call information for [facility]   Thompson Caul, PA-C [phone] (office) [email or web address]

## 2022-10-26 NOTE — Progress Notes (Signed)
  Progress Note   Patient: Ebony Olson DOB: 1945/07/27 DOA: 10/24/2022     2 DOS: the patient was seen and examined on 10/26/2022   Brief hospital course: Ebony Olson is a 77 y.o. female with medical history significant of moderate dementia, left lower extremity DVT sp thrombectomy (04/2022), pulmonary embolism, hypertension, PSVT, TIA, chronic anemia and osteoporosis who presented after a mechanical fall.  Right leg intertrochanteric fracture, status post cephalomedullary nail fixation.  Patient has symptomatic anemia, was on Eliquis which is held.  Status post transfusion of 2 units.  Hemoglobin is in the range of 12.  Continue to monitor closely.  Patient working with physical therapy will most likely need rehab.  Assessment and Plan: * Closed right hip fracture, initial encounter [facility])  Patient underwent cephalomedullary nail fixation today's postoperative day 2. Continue pain control with morphine and oxycodone.  DVT prophylaxis, mechanical for now and resume apixaban when [facility] per surgical team.  Follow up with post operative PT and OT.   Hypoxia :  Etiology not clear - Patient is currently on 2 L of oxygen -Check chest x-ray, incentive spirometry initiated.  Symptomatic anemia Patient has received 2 units prior to transfer to [facility] with appropriate response.  Her hgb this am is 7.8, got transfused with 2 more units Currently on apixaban. Repeat hemoglobin today is 12. Her family is requesting a formal GI evaluation during this hospitalization for anemia workup.  Stool for occult blood ordered.  If positive will consult GI.  Dyslipidemia Continue statin therapy.  DVT (deep venous thrombosis) (HCC) Chronic deep vein thrombosis.  Resumed apixaban for anticoagulation post procedure, when [facility] per surgical team. For now continue with mechanical compression devices.   Dementia (HCC) Continue clonazepam and nortriptyline.  GERD (gastroesophageal reflux disease) Resume  proton pump  inhibitor.         Subjective: Patient seen and examined bedside today.  Patient reports surgical pain is better but she feels very fatigued.  Patient is also on oxygen support does complain of having some mild shortness of breath.  Physical Exam: Vitals:   10/25/22 1926 10/25/22 2355 10/26/22 0432 10/26/22 0739  BP: (!) 106/55 (!) 95/55 (!) 107/53 (!) 107/46  Pulse: 96 100 (!) 104 100  Resp: 18 18 18 16   Temp: 98.3 F (36.8 C) 98.8 F (37.1 C)  98.5 F (36.9 C)  TempSrc: Oral Oral    SpO2: 97% 90% 90% 91%  Weight:      Height:       Neurology awake and alert ENT with mild pallor, no icterus, dry mucous membranes Cardiovascular with S1 and S2 present and rhythmic with no gallops, rubs or murmurs Respiratory with no rales or wheezing, no rhonchi on anterior auscultation Abdomen soft and not distended, non tender Left lower extremity non pitting edema +. Right lower extremity shortened and externally rotated.  Data Reviewed:  Reviewed hemoglobin today is 10.0  Family Communication: Spoke to patient's daughter at bedside.  Disposition: Status is: Inpatient Remains inpatient appropriate because: Status post cephalomedullary nail fixation, hypoxia  Planned Discharge Destination: Skilled nursing facility    Time spent: 35 minutes  Author: Harold Hedge, MD 10/26/2022 1:47 PM  For on call review [email or web address].

## 2022-10-26 NOTE — TOC Initial Note (Signed)
Transition of Care [facility]) - Initial/Assessment Note    Patient Details  Name: Ebony Olson MRN: [id number] Date of Birth: [birth year]  Transition of Care [facility]) CM/SW Contact:    Erin Sons, LCSW Phone Number: 10/26/2022, 1:29 PM  Clinical Narrative:                  CSW met with pt and pt's daughter bedside to discuss STR recommendation. Daughter states that she has been in communication with case manager at [facility] and states they have agreed to take pt. She provides CSW with contact info for [place] CM, Kennyth Arnold. Phone: [phone] Fax:[phone]  CSW faxed referral.   1300: CSW called Kennyth Arnold and confirmed she received referral. Referral is being reviewed by medical director. She will contact CSW with decision.    Expected Discharge Plan: IP Rehab Facility (IR vs SNF) Barriers to Discharge: Continued Medical Work up, English as a second language teacher   Patient Goals and CMS Choice            Expected Discharge Plan and Services                                              Prior Living Arrangements/Services                       Activities of Daily Living Home Assistive Devices/Equipment: Environmental consultant (specify type), Wheelchair, Bedside commode/3-in-1 ADL Screening (condition at time of admission) Patient's cognitive ability adequate to safely complete daily activities?: No Is the patient deaf or have difficulty hearing?: No Does the patient have difficulty seeing, even when wearing glasses/contacts?: No Does the patient have difficulty concentrating, remembering, or making decisions?: Yes Patient able to express need for assistance with ADLs?: No Does the patient have difficulty dressing or bathing?: Yes Independently performs ADLs?: No Communication: Independent Dressing (OT): Needs assistance Is this a change from baseline?: Pre-admission baseline Feeding: Needs assistance Is this a change from baseline?: Pre-admission baseline Bathing: Needs assistance Is  this a change from baseline?: Pre-admission baseline Toileting: Needs assistance Is this a change from baseline?: Pre-admission baseline In/Out Bed: Needs assistance Is this a change from baseline?: Pre-admission baseline Walks in Home: Independent with device (comment) Does the patient have difficulty walking or climbing stairs?: Yes Weakness of Legs: Both Weakness of Arms/Hands: None  Permission Sought/Granted                  Emotional Assessment       Orientation: : Oriented to Self Alcohol / Substance Use: Not Applicable Psych Involvement: No (comment)  Admission diagnosis:  Hip fracture (HCC) [S72.009A] Patient Active Problem List   Diagnosis Date Noted   Closed right hip fracture, initial encounter (HCC) 10/24/2022   Symptomatic anemia 10/24/2022   Dyslipidemia 10/24/2022   DVT (deep venous thrombosis) (HCC) 10/24/2022   Dementia (HCC) 10/24/2022   GERD (gastroesophageal reflux disease) 10/24/2022   Scoliosis (and kyphoscoliosis), idiopathic 12/10/2014   Osteoporosis 12/10/2014   Thrombophlebitis 12/10/2014   High cholesterol 12/10/2014   PCP:  Glenis Smoker, MD Pharmacy:   [facility] [phone] [place], [place] - [address] [facility] [address] [place] [address] Phone: [phone] Fax: [phone]     Social Determinants of Health (SDOH) Social History: SDOH Screenings   Food Insecurity: No Food Insecurity (10/24/2022)  Housing: Low  Risk  (10/24/2022)  Transportation Needs: No Transportation Needs (10/24/2022)  Utilities: Not At Risk (10/24/2022)  Tobacco Use: Unknown (10/25/2022)   SDOH Interventions:     Readmission Risk Interventions     No data to display

## 2022-10-26 NOTE — Care Management Important Message (Signed)
Important Message  Patient Details  Name: Ebony Olson MRN: [id number] Date of Birth: [birth year]   Medicare Important Message Given:  Yes     Sherilyn Banker 10/26/2022, 11:36 AM

## 2022-10-26 NOTE — Discharge Instructions (Signed)
Orthopaedic Trauma Service Discharge Instructions   General Discharge Instructions  WEIGHT BEARING STATUS:weightbearing as tolerated  RANGE OF MOTION/ACTIVITY:Unrestricted hip and knee motion  Wound Care: You may remove your surgical dressing. Incisions can be left open to air if there is no drainage. Once the incision is completely dry and without drainage, it may be left open to air out.  Showering may begin 10/27/2022.  Clean incision gently with soap and water.  DVT/PE prophylaxis:  Home dose Eliquis  Diet: as you were eating previously.  Can use over the counter stool softeners and bowel preparations, such as Miralax, to help with bowel movements.  Narcotics can be constipating.  Be sure to drink plenty of fluids  PAIN MEDICATION USE AND EXPECTATIONS  You have likely been given narcotic medications to help control your pain.  After a traumatic event that results in an fracture (broken bone) with or without surgery, it is ok to use narcotic pain medications to help control one's pain.  We understand that everyone responds to pain differently and each individual patient will be evaluated on a regular basis for the continued need for narcotic medications. Ideally, narcotic medication use should last no more than 6-8 weeks (coinciding with fracture healing).   As a patient it is your responsibility as well to monitor narcotic medication use and report the amount and frequency you use these medications when you come to your office visit.   We would also advise that if you are using narcotic medications, you should take a dose prior to therapy to maximize you participation.  IF YOU ARE ON NARCOTIC MEDICATIONS IT IS NOT PERMISSIBLE TO OPERATE A MOTOR VEHICLE (MOTORCYCLE/CAR/TRUCK/MOPED) OR HEAVY MACHINERY DO NOT MIX NARCOTICS WITH OTHER CNS (CENTRAL NERVOUS SYSTEM) DEPRESSANTS SUCH AS ALCOHOL   STOP SMOKING OR USING NICOTINE PRODUCTS!!!!  As discussed nicotine severely impairs your body's  ability to heal surgical and traumatic wounds but also impairs bone healing.  Wounds and bone heal by forming microscopic blood vessels (angiogenesis) and nicotine is a vasoconstrictor (essentially, shrinks blood vessels).  Therefore, if vasoconstriction occurs to these microscopic blood vessels they essentially disappear and are unable to deliver necessary nutrients to the healing tissue.  This is one modifiable factor that you can do to dramatically increase your chances of healing your injury.    (This means no smoking, no nicotine gum, patches, etc)  DO NOT USE NONSTEROIDAL ANTI-INFLAMMATORY DRUGS (NSAID'S)  Using products such as Advil (ibuprofen), Aleve (naproxen), Motrin (ibuprofen) for additional pain control during fracture healing can delay and/or prevent the healing response.  If you would like to take over the counter (OTC) medication, Tylenol (acetaminophen) is ok.  However, some narcotic medications that are given for pain control contain acetaminophen as well. Therefore, you should not exceed more than 4000 mg of tylenol in a day if you do not have liver disease.  Also note that there are may OTC medicines, such as cold medicines and allergy medicines that my contain tylenol as well.  If you have any questions about medications and/or interactions please ask your doctor/PA or your pharmacist.      ICE AND ELEVATE INJURED/OPERATIVE EXTREMITY  Using ice and elevating the injured extremity above your heart can help with swelling and pain control.  Icing in a pulsatile fashion, such as 20 minutes on and 20 minutes off, can be followed.    Do not place ice directly on skin. Make sure there is a barrier between to skin and the ice  pack.    Using frozen items such as frozen peas works well as the conform nicely to the are that needs to be iced.  USE AN ACE WRAP OR TED HOSE FOR SWELLING CONTROL  In addition to icing and elevation, Ace wraps or TED hose are used to help limit and resolve swelling.   It is recommended to use Ace wraps or TED hose until you are informed to stop.    When using Ace Wraps start the wrapping distally (farthest away from the body) and wrap proximally (closer to the body)   Example: If you had surgery on your leg or thing and you do not have a splint on, start the ace wrap at the toes and work your way up to the thigh        If you had surgery on your upper extremity and do not have a splint on, start the ace wrap at your fingers and work your way up to the upper arm   CALL THE OFFICE WITH ANY QUESTIONS OR CONCERNS: [phone]   VISIT OUR WEBSITE FOR ADDITIONAL INFORMATION: [email or web address]   Discharge Wound Care Instructions  Do NOT apply any ointments, solutions or lotions to pin sites or surgical wounds.  These prevent needed drainage and even though solutions like hydrogen peroxide kill bacteria, they also damage cells lining the pin sites that help fight infection.  Applying lotions or ointments can keep the wounds moist and can cause them to breakdown and open up as well. This can increase the risk for infection. When in doubt call the office.  If any drainage is noted, use one layer of adaptic or Mepitel, then gauze, Kerlix, and an ace wrap. - These dressing supplies should be available at local medical supply stores [facility], etc) as well as Insurance claims handler ([facility], [facility], [place], etc)  Once the incision is completely dry and without drainage, it may be left open to air out.  Showering may begin 36-48 hours later.  Cleaning gently with soap and water.

## 2022-10-26 NOTE — TOC CAGE-AID Note (Signed)
Transition of Care [facility]) - CAGE-AID Screening   Patient Details  Name: Ebony Olson MRN: [id number] Date of Birth: [birth year]  [facility]) CM/SW Contact:    Leota Sauers, RN Phone Number: 10/26/2022, 12:35 AM   Clinical Narrative:  Patient denies use of alcohol and illicit drugs. Education not offered at this time.  CAGE-AID Screening:    Have You Ever Felt You Ought to Cut Down on Your Drinking or Drug Use?: No Have People Annoyed You By Critizing Your Drinking Or Drug Use?: No Have You Felt Bad Or Guilty About Your Drinking Or Drug Use?: No Have You Ever Had a Drink or Used Drugs First Thing In The Morning to Steady Your Nerves or to Get Rid of a Hangover?: No CAGE-AID Score: 0  Substance Abuse Education Offered: No

## 2022-10-27 DIAGNOSIS — S72001A Fracture of unspecified part of neck of right femur, initial encounter for closed fracture: Secondary | ICD-10-CM | POA: Diagnosis not present

## 2022-10-27 MED ORDER — BACITRACIN-POLYMYXIN B 500-10000 UNIT/GM OP OINT
TOPICAL_OINTMENT | Freq: Four times a day (QID) | OPHTHALMIC | Status: AC
  Administered 2022-11-02: 1 via OPHTHALMIC
  Filled 2022-10-27: qty 3.5

## 2022-10-27 NOTE — Progress Notes (Signed)
Soap sud enema yield small results pt. unable to hold in mixture

## 2022-10-27 NOTE — Progress Notes (Signed)
Occupational Therapy Treatment Patient Details Name: Ebony Olson MRN: [id number] DOB: 03-10-1946 Today's Date: 10/27/2022   History of present illness 77 y.o. female with medical history significant of moderate dementia, left lower extremity DVT sp thrombectomy (04/2022), pulmonary embolism, hypertension, PSVT, TIA, chronic anemia and osteoporosis who presented after a mechanical fall sustaining a closed right hip fracture. 7/7 underwent cephalomedullary nailing of her right hip.   OT comments  Pt continues to be limited by FOF and anxiety with mobility, Pt also limited in movement with R hip pain. Pt needing total A +2 for mobility and ADLs as she is very perseverated on getting hurt. Pt needing total A +2 for repositioning in chair as well, lift pad placed underneath pt with BLEs elevated. OT to continue to progress pt as able, DC plans remain appropriate for SNF.   Recommendations for follow up therapy are one component of a multi-disciplinary discharge planning process, led by the attending physician.  Recommendations may be updated based on patient status, additional functional criteria and insurance authorization.    Assistance Recommended at Discharge Frequent or constant Supervision/Assistance  Patient can return home with the following  Two people to help with walking and/or transfers;Two people to help with bathing/dressing/bathroom;Help with stairs or ramp for entrance;Assist for transportation   Equipment Recommendations  Other (comment) (TBD at next level of care)    Recommendations for Other Services      Precautions / Restrictions Precautions Precautions: Fall Precaution Comments: baseline LLE weakness. dementia Restrictions Weight Bearing Restrictions: Yes RLE Weight Bearing: Weight bearing as tolerated       Mobility Bed Mobility Overal bed mobility: Needs Assistance Bed Mobility: Supine to Sit     Supine to sit: Total assist, +2 for safety/equipment, +2 for  physical assistance, HOB elevated     General bed mobility comments: VC provided for hand placement and use of bed rail to assist with bed mobility. Pt unable to assist with bed mobility d/t fear and anticipation of pain upon any movement/mobility. Pt required 2 person assist to complete helicopter method using bed pad.    Transfers Overall transfer level: Needs assistance Equipment used: 2 person hand held assist Transfers: Sit to/from Stand, Bed to chair/wheelchair/BSC Sit to Stand: Total assist, +2 physical assistance, +2 safety/equipment, From elevated surface Stand pivot transfers: Total assist, +2 physical assistance, +2 safety/equipment, From elevated surface         General transfer comment: VC and total assist for hand placement and set-up prior to standing. OT assist with bilat heel positioning. Maxi sky lift pad placed underneath pt.     Balance Overall balance assessment: Needs assistance Sitting-balance support: Bilateral upper extremity supported, Feet supported Sitting balance-Leahy Scale: Poor Sitting balance - Comments: sitting EOB Postural control: Posterior lean Standing balance support: Bilateral upper extremity supported, During functional activity, Reliant on assistive device for balance Standing balance-Leahy Scale: Zero Standing balance comment: Reliant on +2 HHA                           ADL either performed or assessed with clinical judgement   ADL                                         General ADL Comments: Pt requires total assist for all BADL tasks at this time. Bed level ADL of 2 person is  required.    Extremity/Trunk Assessment              Vision       Perception     Praxis      Cognition Arousal/Alertness: Awake/alert Behavior During Therapy: Anxious Overall Cognitive Status: History of cognitive impairments - at baseline                                 General Comments: Pt repeating  "please don't hurt me" even when not doing any mobility. Pt with high FOF        Exercises      Shoulder Instructions       General Comments SpO2 between 94-97% on RA. Pt received on 0.5L at 100%  HR up to 111 bpm with mobility but down to 102 at end of session. Pt likely very anxious    Pertinent Vitals/ Pain       Pain Assessment Pain Assessment: Faces Faces Pain Scale: Hurts worst Pain Location: right hip during any movement and mobility Pain Descriptors / Indicators: Grimacing, Guarding, Other (Comment), Aching, Moaning Pain Intervention(s): Repositioned, Monitored during session, Limited activity within patient's tolerance, Relaxation  Home Living                                          Prior Functioning/Environment              Frequency  Min 2X/week        Progress Toward Goals  OT Goals(current goals can now be found in the care plan section)  Progress towards OT goals: Progressing toward goals  Acute Rehab OT Goals OT Goal Formulation: Patient unable to participate in goal setting Time For Goal Achievement: 11/08/22 Potential to Achieve Goals: Fair  Plan Discharge plan remains appropriate;Frequency remains appropriate    Co-evaluation    PT/OT/SLP Co-Evaluation/Treatment: Yes Reason for Co-Treatment: To address functional/ADL transfers;For patient/therapist safety;Necessary to address cognition/behavior during functional activity PT goals addressed during session: Mobility/safety with mobility;Proper use of DME OT goals addressed during session: ADL's and self-care;Strengthening/ROM;Proper use of Adaptive equipment and DME      AM-PAC OT "6 Clicks" Daily Activity     Outcome Measure   Help from another person eating meals?: A Little Help from another person taking care of personal grooming?: Total Help from another person toileting, which includes using toliet, bedpan, or urinal?: Total Help from another person bathing  (including washing, rinsing, drying)?: Total Help from another person to put on and taking off regular upper body clothing?: Total Help from another person to put on and taking off regular lower body clothing?: Total 6 Click Score: 8    End of Session Equipment Utilized During Treatment: Rolling walker (2 wheels);Gait belt  OT Visit Diagnosis: Unsteadiness on feet (R26.81);Muscle weakness (generalized) (M62.81);History of falling (Z91.81);Pain Pain - Right/Left: Right Pain - part of body: Hip   Activity Tolerance Patient limited by pain   Patient Left in chair;with call bell/phone within reach;with chair alarm set;with family/visitor present   Nurse Communication Mobility status;Need for lift equipment        Time: 0981-1914 OT Time Calculation (min): 32 min  Charges: OT General Charges $OT Visit: 1 Visit OT Treatments $Therapeutic Activity: 8-22 mins  10/27/2022  AB, OTR/L  Acute Rehabilitation Services  Office: [phone]   Ethelene Browns  D Adora Yeh 10/27/2022, 12:03 PM

## 2022-10-27 NOTE — Progress Notes (Signed)
Verbal order for soap sud enema per MD Rickey Barbara

## 2022-10-27 NOTE — Progress Notes (Signed)
  Progress Note   Patient: Ebony Olson ZOX:[id number] DOB: 27-Dec-1945 DOA: 10/24/2022     3 DOS: the patient was seen and examined on 10/27/2022   Brief hospital course: 77 y.o. female with medical history significant of moderate dementia, left lower extremity DVT sp thrombectomy (04/2022), pulmonary embolism, hypertension, PSVT, TIA, chronic anemia and osteoporosis who presented after a mechanical fall.  Right leg intertrochanteric fracture, status post cephalomedullary nail fixation.  Patient has symptomatic anemia, was on Eliquis which is held.  Status post transfusion of 2 units.  Hemoglobin is in the range of 12.  Continue to monitor closely.  Patient working with physical therapy will most likely need rehab.   Assessment and Plan:  Closed right hip fracture, initial encounter [facility])  Patient underwent cephalomedullary nail fixation today's postoperative day 2. Continue pain control with morphine and oxycodone.  DVT prophylaxis, mechanical for now and resume apixaban when [facility] per surgical team.  Follow up with post operative PT and OT, recs for SNF   Hypoxia :  Etiology not clear -only on 0.5L this AM when seen with PT -wean to room air as tolerated   Symptomatic anemia Patient has received 2 units prior to transfer to [facility] with appropriate response.  Pt required 2 more units PRBC's 7/7 Currently on apixaban. Occult stool pending   Dyslipidemia Continue statin therapy.   DVT (deep venous thrombosis) (HCC) Chronic deep vein thrombosis.  Resumed apixaban for anticoagulation post procedure, when [facility] per surgical team. For now continue with mechanical compression devices.    Dementia (HCC) Continue clonazepam and nortriptyline.   GERD (gastroesophageal reflux disease) Resume proton pump  inhibitor.    Conjunctivitis -Pt reporting itchy R eye that seems matted down in the AM. Will order trial of polymyxin ointment  Subjective: Pt complains of itchy eyes this AM  Physical  Exam: Vitals:   10/26/22 1607 10/26/22 2114 10/27/22 0445 10/27/22 0755  BP: (!) 110/54 (!) 97/55 (!) 104/50 109/60  Pulse: 94 97 96 100  Resp:  15 16 17   Temp: 98.7 F (37.1 C) 98.8 F (37.1 C) 98.6 F (37 C) 98.3 F (36.8 C)  TempSrc: Oral Oral Oral   SpO2: 100% 100% 100% 95%  Weight:      Height:       General exam: Awake, laying in bed, in nad Respiratory system: Normal respiratory effort, no wheezing Cardiovascular system: regular rate, s1, s2 Gastrointestinal system: Soft, nondistended, positive BS Central nervous system: CN2-12 grossly intact, strength intact Extremities: Perfused, no clubbing Skin: Normal skin turgor, no notable skin lesions seen Psychiatry: Mood normal // no visual hallucinations   Data Reviewed:  Labs reviewed: Na 134, K 4.2, Cr 1.05, WBC 8.5, Hgb 12.4, Plts 185  Family Communication: Pt in room, family at bedside  Disposition: Status is: Inpatient Remains inpatient appropriate because: Severity of illness  Planned Discharge Destination: Skilled nursing facility    Author: Rickey Barbara, MD 10/27/2022 3:17 PM  For on call review [email or web address].

## 2022-10-27 NOTE — Progress Notes (Signed)
Physical Therapy Treatment Patient Details Name: TYNISHA OGAN MRN: [id number] DOB: Oct 01, 1945 Today's Date: 10/27/2022   History of Present Illness 77 y.o. female with medical history significant of moderate dementia, left lower extremity DVT sp thrombectomy (04/2022), pulmonary embolism, hypertension, PSVT, TIA, chronic anemia and osteoporosis who presented after a mechanical fall sustaining a closed right hip fracture. 7/7 underwent cephalomedullary nailing of her right hip.    PT Comments  Pt with similar presentation to previous session. No change in DC/DME recs at this time. PT will continue to follow.     Assistance Recommended at Discharge Frequent or constant Supervision/Assistance  If plan is discharge home, recommend the following:  Can travel by private vehicle    Two people to help with walking and/or transfers;A lot of help with bathing/dressing/bathroom;Assistance with cooking/housework;Direct supervision/assist for medications management;Direct supervision/assist for financial management;Assist for transportation;Help with stairs or ramp for entrance   No  Equipment Recommendations  Other (comment) (Per accepting facility)    Recommendations for Other Services       Precautions / Restrictions Precautions Precautions: Fall Precaution Comments: baseline LLE weakness. dementia Restrictions Weight Bearing Restrictions: Yes RLE Weight Bearing: Weight bearing as tolerated     Mobility  Bed Mobility Overal bed mobility: Needs Assistance Bed Mobility: Supine to Sit     Supine to sit: Total assist, +2 for safety/equipment, +2 for physical assistance, HOB elevated     General bed mobility comments: VC provided for hand placement and use of bed rail to assist with bed mobility. Pt unable to assist with bed mobility d/t fear and anticipation of pain upon any movement/mobility. Pt required 2 person assist to complete helicopter method using bed pad.     Transfers Overall transfer level: Needs assistance Equipment used: 2 person hand held assist Transfers: Sit to/from Stand, Bed to chair/wheelchair/BSC Sit to Stand: Total assist, +2 physical assistance, +2 safety/equipment, From elevated surface Stand pivot transfers: Total assist, +2 physical assistance, +2 safety/equipment, From elevated surface         General transfer comment: Initially pt completed sit to stand with 2 person face to face technique utilizing RW. VC and total assist for hand placement and set-up prior to standing. Bed pad used to help lift hips off bed. Pt stood at EOB for ~90 seconds before sitting back down. Pt verbalized that she wished to get into the recliner although unable to move/advance either foot to transfer when provided VC. Second attempt, RW was not utilized and 2 person face to face technique was completed to complete stand pivot towards recliner. Maxi sky lift pad placed underneath pt. (No change from previous session)    Ambulation/Gait               General Gait Details: Pt unable   Stairs             Wheelchair Mobility     Tilt Bed    Modified Rankin (Stroke Patients Only)       Balance Overall balance assessment: Needs assistance Sitting-balance support: Bilateral upper extremity supported, Feet supported Sitting balance-Leahy Scale: Poor Sitting balance - Comments: sitting EOB Postural control: Posterior lean Standing balance support: Bilateral upper extremity supported, During functional activity, Reliant on assistive device for balance Standing balance-Leahy Scale: Zero Standing balance comment: Reliant on +2 HHA                            Cognition Arousal/Alertness: Awake/alert Behavior During  Therapy: Anxious Overall Cognitive Status: History of cognitive impairments - at baseline                                 General Comments: Dementia. Pt perseverates on extremely high fear of  falling.        Exercises      General Comments General comments (skin integrity, edema, etc.): SpO2 between 94-97% on RA. Pt received on 0.5L at 100%      Pertinent Vitals/Pain Pain Assessment Pain Assessment: Faces Faces Pain Scale: Hurts worst Pain Location: right hip during any movement and mobility Pain Descriptors / Indicators: Grimacing, Guarding, Other (Comment), Aching, Moaning (Yelling) Pain Intervention(s): Monitored during session, Limited activity within patient's tolerance, Premedicated before session, Repositioned    Home Living                          Prior Function            PT Goals (current goals can now be found in the care plan section) Progress towards PT goals: Progressing toward goals    Frequency    Min 3X/week      PT Plan Current plan remains appropriate    Co-evaluation              AM-PAC PT "6 Clicks" Mobility   Outcome Measure  Help needed turning from your back to your side while in a flat bed without using bedrails?: Total Help needed moving from lying on your back to sitting on the side of a flat bed without using bedrails?: Total Help needed moving to and from a bed to a chair (including a wheelchair)?: Total Help needed standing up from a chair using your arms (e.g., wheelchair or bedside chair)?: Total Help needed to walk in hospital room?: Total Help needed climbing 3-5 steps with a railing? : Total 6 Click Score: 6    End of Session Equipment Utilized During Treatment: Gait belt Activity Tolerance: Patient limited by pain;Other (comment) (Anxiety/fear of falling) Patient left: in chair;with call bell/phone within reach;with chair alarm set;with family/visitor present Nurse Communication: Mobility status PT Visit Diagnosis: Other abnormalities of gait and mobility (R26.89)     Time: 1610-9604 PT Time Calculation (min) (ACUTE ONLY): 32 min  Charges:    $Therapeutic Activity: 8-22 mins PT General  Charges $$ ACUTE PT VISIT: 1 Visit                     Shela Nevin, PT, DPT Acute Rehab Services [phone]    Gladys Damme 10/27/2022, 11:43 AM

## 2022-10-27 NOTE — Hospital Course (Signed)
77 y.o. female with medical history significant of moderate dementia, left lower extremity DVT sp thrombectomy (04/2022), pulmonary embolism, hypertension, PSVT, TIA, chronic anemia and osteoporosis who presented after a mechanical fall.  Right leg intertrochanteric fracture, status post cephalomedullary nail fixation.  Patient has symptomatic anemia, was on Eliquis which is held.  Status post transfusion of 2 units.  Hemoglobin is in the range of 12.  Continue to monitor closely.  Patient working with physical therapy will most likely need rehab.

## 2022-10-27 NOTE — TOC Progression Note (Signed)
Transition of Care [facility]) - Progression Note    Patient Details  Name: Ebony Olson MRN: [id number] Date of Birth: [birth year]  Transition of Care [facility]) CM/SW Contact  Burris Matherne [place], [place] Phone Number: 10/27/2022, 2:41 PM  Clinical Narrative:     CSW contacted Stacy with [facility] and is informed they have accepted pt clinically and have already submitted for insurance auth. She requests additional clinicals be faxed to her. CSW faxed clinicals to [phone]   Expected Discharge Plan: IP Rehab Facility (IR vs SNF) Barriers to Discharge: Continued Medical Work up, Conservator, museum/gallery and Services                                               Social Determinants of Health (SDOH) Interventions SDOH Screenings   Food Insecurity: No Food Insecurity (10/24/2022)  Housing: Low Risk  (10/24/2022)  Transportation Needs: No Transportation Needs (10/24/2022)  Utilities: Not At Risk (10/24/2022)  Tobacco Use: Unknown (10/25/2022)    Readmission Risk Interventions     No data to display

## 2022-10-28 DIAGNOSIS — S72001A Fracture of unspecified part of neck of right femur, initial encounter for closed fracture: Secondary | ICD-10-CM | POA: Diagnosis not present

## 2022-10-28 LAB — CBC
HCT: 32.9 % — ABNORMAL LOW (ref 36.0–46.0)
Hemoglobin: 10.6 g/dL — ABNORMAL LOW (ref 12.0–15.0)
MCH: 31.2 pg (ref 26.0–34.0)
MCHC: 32.2 g/dL (ref 30.0–36.0)
MCV: 96.8 fL (ref 80.0–100.0)
Platelets: 192 10*3/uL (ref 150–400)
RBC: 3.4 MIL/uL — ABNORMAL LOW (ref 3.87–5.11)
RDW: 20.4 % — ABNORMAL HIGH (ref 11.5–15.5)
WBC: 6 10*3/uL (ref 4.0–10.5)
nRBC: 0 % (ref 0.0–0.2)

## 2022-10-28 LAB — COMPREHENSIVE METABOLIC PANEL
ALT: 6 U/L (ref 0–44)
AST: 24 U/L (ref 15–41)
Albumin: 1.8 g/dL — ABNORMAL LOW (ref 3.5–5.0)
Alkaline Phosphatase: 173 U/L — ABNORMAL HIGH (ref 38–126)
Anion gap: 7 (ref 5–15)
BUN: 18 mg/dL (ref 8–23)
CO2: 24 mmol/L (ref 22–32)
Calcium: 8.2 mg/dL — ABNORMAL LOW (ref 8.9–10.3)
Chloride: 107 mmol/L (ref 98–111)
Creatinine, Ser: 1 mg/dL (ref 0.44–1.00)
GFR, Estimated: 59 mL/min — ABNORMAL LOW (ref >60)
Glucose, Bld: 149 mg/dL — ABNORMAL HIGH (ref 70–99)
Potassium: 4.5 mmol/L (ref 3.5–5.1)
Sodium: 138 mmol/L (ref 135–145)
Total Bilirubin: 1.5 mg/dL — ABNORMAL HIGH (ref 0.3–1.2)
Total Protein: 4.9 g/dL — ABNORMAL LOW (ref 6.5–8.1)

## 2022-10-28 LAB — OCCULT BLOOD X 1 CARD TO LAB, STOOL: Fecal Occult Bld: POSITIVE — AB

## 2022-10-28 MED ORDER — SENNA 8.6 MG PO TABS
1.0000 | ORAL_TABLET | Freq: Every day | ORAL | Status: DC
  Administered 2022-10-28 – 2022-11-02 (×5): 8.6 mg via ORAL
  Filled 2022-10-28 (×7): qty 1

## 2022-10-28 MED ORDER — POLYETHYLENE GLYCOL 3350 17 G PO PACK
17.0000 g | PACK | Freq: Two times a day (BID) | ORAL | Status: DC
  Administered 2022-10-28 – 2022-10-31 (×7): 17 g via ORAL
  Filled 2022-10-28 (×12): qty 1

## 2022-10-28 NOTE — TOC Progression Note (Signed)
Transition of Care [facility]) - Progression Note    Patient Details  Name: Ebony Olson MRN: [id number] Date of Birth: 02-04-1946  Transition [facility]) CM/SW Contact  Erin Sons, [place] Phone Number: 10/28/2022, 11:49 AM  Clinical Narrative:     CSW informed by Kennyth Arnold with [facility] that Berkley Harvey is still pending.   Expected Discharge Plan: IP Rehab Facility (IR vs SNF) Barriers to Discharge: Continued Medical Work up, Conservator, museum/gallery and Services                                               Social Determinants of Health (SDOH) Interventions SDOH Screenings   Food Insecurity: No Food Insecurity (10/24/2022)  Housing: Low Risk  (10/24/2022)  Transportation Needs: No Transportation Needs (10/24/2022)  Utilities: Not At Risk (10/24/2022)  Tobacco Use: Unknown (10/24/2022)    Readmission Risk Interventions     No data to display

## 2022-10-28 NOTE — Plan of Care (Signed)
Pt having a lot of pain with turning left to right.

## 2022-10-28 NOTE — Progress Notes (Signed)
  Progress Note   Patient: Ebony Olson:[id number] DOB: [birth year] DOA: 10/24/2022     4 DOS: the patient was seen and examined on 10/28/2022   Brief hospital course: 76 y.o. female with medical history significant of moderate dementia, left lower extremity DVT sp thrombectomy (04/2022), pulmonary embolism, hypertension, PSVT, TIA, chronic anemia and osteoporosis who presented after a mechanical fall.  Right leg intertrochanteric fracture, status post cephalomedullary nail fixation.  Patient has symptomatic anemia, was on Eliquis which is held.  Status post transfusion of 2 units.  Hemoglobin is in the range of 12.  Continue to monitor closely.  Patient working with physical therapy will most likely need rehab.   Assessment and Plan:  Closed right hip fracture, initial encounter [facility])  Patient underwent cephalomedullary nail fixation today's postoperative day 2. Continue pain control with morphine and oxycodone.  Continued on eliquis  PT/OT, recs for SNF   Hypoxia :  -resolved, now on room air   Symptomatic anemia Patient has received 2 units prior to transfer to [facility] with appropriate response.  Pt required 2 more units PRBC's 7/7 Currently on apixaban. Occult stool is pos, not grossly bloody or melanotic. Hgb stable this AM   Dyslipidemia Continue statin therapy.   DVT (deep venous thrombosis) (HCC) Chronic deep vein thrombosis.  Continued on eliquis   Dementia (HCC) Continue clonazepam and nortriptyline.   GERD (gastroesophageal reflux disease) Resume proton pump  inhibitor.    Conjunctivitis -Recently reported itchy R eye that seems matted down in the AM.  -Improved with polymyxin eye ointment  Subjective: Reports itchy eye has improved  Physical Exam: Vitals:   10/27/22 1959 10/28/22 0408 10/28/22 0739 10/28/22 1322  BP: (!) 130/43 (!) 121/56 117/60 (!) 102/56  Pulse: (!) 107 (!) 108 100 98  Resp: 15 18  18   Temp: 98.1 F (36.7 C) 98.4 F (36.9 C) 98.3 F (36.8  C) 98.7 F (37.1 C)  TempSrc: Oral  Oral Oral  SpO2: 95% 92% 91% 92%  Weight:      Height:       General exam: Conversant, in no acute distress Respiratory system: normal chest rise, clear, no audible wheezing Cardiovascular system: regular rhythm, s1-s2 Gastrointestinal system: Nondistended, nontender, pos BS Central nervous system: No seizures, no tremors Extremities: No cyanosis, no joint deformities Skin: No rashes, no pallor Psychiatry: Affect normal // no auditory hallucinations   Data Reviewed:  Labs reviewed: Na 138, K 4.5, Cr 1.00, WBC 6.0, Hgb 10.6  Family Communication: Pt in room, family at bedside  Disposition: Status is: Inpatient Remains inpatient appropriate because: Severity of illness  Planned Discharge Destination: Skilled nursing facility    Author: Rickey Barbara, MD 10/28/2022 5:48 PM  For on call review [email or web address].

## 2022-10-29 DIAGNOSIS — R195 Other fecal abnormalities: Secondary | ICD-10-CM

## 2022-10-29 DIAGNOSIS — F0390 Unspecified dementia without behavioral disturbance: Secondary | ICD-10-CM

## 2022-10-29 DIAGNOSIS — D649 Anemia, unspecified: Secondary | ICD-10-CM | POA: Diagnosis not present

## 2022-10-29 DIAGNOSIS — I10 Essential (primary) hypertension: Secondary | ICD-10-CM | POA: Diagnosis not present

## 2022-10-29 DIAGNOSIS — S72001A Fracture of unspecified part of neck of right femur, initial encounter for closed fracture: Secondary | ICD-10-CM | POA: Diagnosis not present

## 2022-10-29 LAB — COMPREHENSIVE METABOLIC PANEL
ALT: 9 U/L (ref 0–44)
AST: 36 U/L (ref 15–41)
Albumin: 2 g/dL — ABNORMAL LOW (ref 3.5–5.0)
Alkaline Phosphatase: 208 U/L — ABNORMAL HIGH (ref 38–126)
Anion gap: 8 (ref 5–15)
BUN: 17 mg/dL (ref 8–23)
CO2: 22 mmol/L (ref 22–32)
Calcium: 8.5 mg/dL — ABNORMAL LOW (ref 8.9–10.3)
Chloride: 108 mmol/L (ref 98–111)
Creatinine, Ser: 0.9 mg/dL (ref 0.44–1.00)
GFR, Estimated: >60 mL/min (ref >60)
Glucose, Bld: 103 mg/dL — ABNORMAL HIGH (ref 70–99)
Potassium: 4.7 mmol/L (ref 3.5–5.1)
Sodium: 138 mmol/L (ref 135–145)
Total Bilirubin: 1.6 mg/dL — ABNORMAL HIGH (ref 0.3–1.2)
Total Protein: 5.6 g/dL — ABNORMAL LOW (ref 6.5–8.1)

## 2022-10-29 LAB — CBC
HCT: 36.4 % (ref 36.0–46.0)
Hemoglobin: 11.7 g/dL — ABNORMAL LOW (ref 12.0–15.0)
MCH: 31 pg (ref 26.0–34.0)
MCHC: 32.1 g/dL (ref 30.0–36.0)
MCV: 96.6 fL (ref 80.0–100.0)
Platelets: 217 10*3/uL (ref 150–400)
RBC: 3.77 MIL/uL — ABNORMAL LOW (ref 3.87–5.11)
RDW: 19.9 % — ABNORMAL HIGH (ref 11.5–15.5)
WBC: 6.2 10*3/uL (ref 4.0–10.5)
nRBC: 0 % (ref 0.0–0.2)

## 2022-10-29 MED ORDER — PANTOPRAZOLE SODIUM 40 MG PO TBEC: 40.0000 mg | DELAYED_RELEASE_TABLET | Freq: Two times a day (BID) | ORAL | Status: DC

## 2022-10-29 MED ORDER — PANTOPRAZOLE SODIUM 40 MG PO TBEC
40.0000 mg | DELAYED_RELEASE_TABLET | Freq: Every day | ORAL | Status: DC
  Administered 2022-10-30 – 2022-11-03 (×5): 40 mg via ORAL
  Filled 2022-10-29 (×5): qty 1

## 2022-10-29 NOTE — Consult Note (Signed)
Consultation  Referring Provider: Kenyon Ana, DO Primary Care Physician:  Glenis Smoker, MD Primary Gastroenterologist[facility]  [facility]  Reason for Consultation: Anemia, heme positive stool  HPI: Ebony Olson is a 77 y.o. female, who was admitted here 6 days ago after a fall at home and sustained a right hip fracture.  She had surgery on 10/24/2022.  On admit she was noted to have hemoglobin of 7.8/hematocrit of 24, MCV of 97.  She was transfused 2 units of packed RBCs. She has been on chronic Eliquis after a very large left lower extremity DVT for which she underwent extensive thrombectomy in [place] in January 2024.  Also with prior history of DVT/PE, hypertension, history of PSVT, TIA and history of iron deficiency anemia.  Her HGb has been stable since the transfusions, hemoglobin 11.7 today/hematocrit 36.4 Iron studies show serum iron of 42/sat 17 TIBC 252 and ferritin of 145  Not had any overt bleeding in the hospital but has been documented heme positive  Patient's daughter is present in the room and relates all of her recent history.  She does have a history of iron deficiency anemia which is currently being followed by her primary care physician in [place].  She has had 3 transfusions over the past couple of months and has just completed 2 iron infusions.  She had undergone workup in January 2022 for the severe iron deficiency anemia and had colonoscopy and endoscopy I believe done by Dr. Salvadore Oxford which were unremarkable.  At that time she was taken off of Eliquis and discussion was had about having a capsule endoscopy, but did not follow through with that.  She apparently had been better until she had to go back on Eliquis in January.  Patient's daughter would like to have patient follow-up with Dr. Salvadore Oxford GI in [place] when she recuperates from her hip fracture.  She is hoping to have her mom placed at acute rehab in [facility] so that her blood  counts can be monitored etc. over the next couple of weeks.   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)    Right   Dysrhythmia    Fibromyalgia    GAD (generalized anxiety disorder)    High cholesterol    History of compression fracture of vertebral column    History of pulmonary embolus (PE)    Hx of blood clots    x 4 in rt leg   Hypertension    Mitral valve disorder    Mixed hyperlipidemia    Palpitations    Peripheral vascular disease (HCC)    Postmenopausal osteoporosis    Primary cardiomyopathy (HCC)    PSVT (paroxysmal supraventricular tachycardia)    Transient cerebral ischemia    Tricuspid valve disorder    Vitamin B deficiency     Past Surgical History:  Procedure Laterality Date   cyst on spine     INTRAMEDULLARY (IM) NAIL INTERTROCHANTERIC Right 10/24/2022   Procedure: INTRAMEDULLARY (IM) NAIL INTERTROCHANTERIC;  Surgeon: Roby Lofts, MD;  Location: [facility] OR;  Service: Orthopedics;  Laterality: Right;    Prior to Admission medications   Medication Sig Start Date End Date Taking? Authorizing Provider  albuterol (PROVENTIL HFA;VENTOLIN HFA) 108 (90 BASE) MCG/ACT inhaler Inhale into the lungs every 6 (six) hours as needed for wheezing or shortness of breath.  Yes [provider]  apixaban (ELIQUIS) 5 MG TABS tablet Take 5 mg by mouth 2 (two) times daily.   Yes [provider]  dexlansoprazole (DEXILANT) 60 MG capsule Take 60 mg by mouth daily.   Yes [provider]  donepezil (ARICEPT) 10 MG tablet Take 10 mg by mouth at bedtime.   Yes [provider]  ferrous sulfate 325 (65 FE) MG tablet Take 325 mg by mouth daily. 09/03/22  Yes [provider]  mirtazapine (REMERON) 7.5 MG tablet Take 7.5 mg by mouth at bedtime. 10/11/22  Yes [provider]  vitamin B-12 (CYANOCOBALAMIN) 1000 MCG tablet Take 1,000  mcg by mouth daily. Sq Once a month   Yes [provider]  acetaminophen (TYLENOL) 325 MG tablet Take 2 tablets (650 mg total) by mouth every 6 (six) hours as needed for mild pain, moderate pain, headache or fever (or Fever >/= 101). 10/26/22   McClung, Sarah A, PA-C  memantine (NAMENDA) 10 MG tablet Take 10 mg by mouth 2 (two) times daily. 09/25/22   [provider]  methocarbamol (ROBAXIN) 500 MG tablet Take 1 tablet (500 mg total) by mouth every 8 (eight) hours as needed for muscle spasms. 10/26/22   West Bali, PA-C  oxyCODONE-acetaminophen (PERCOCET/ROXICET) 5-325 MG tablet Take 1 tablet by mouth every 6 (six) hours as needed for severe pain. 10/26/22   West Bali, PA-C    Current Facility-Administered Medications  Medication Dose Route Frequency Provider Last Rate Last Admin   acetaminophen (TYLENOL) tablet 650 mg  650 mg Oral Q6H PRN West Bali, PA-C   650 mg at 10/28/22 1132   Or   acetaminophen (TYLENOL) suppository 650 mg  650 mg Rectal Q6H PRN Thyra Breed A, PA-C       albuterol (PROVENTIL) (2.5 MG/3ML) 0.083% nebulizer solution 2.5 mg  2.5 mg Inhalation Q6H PRN Sharon Seller, Sarah A, PA-C       apixaban (ELIQUIS) tablet 5 mg  5 mg Oral BID Ilda Basset, RPH   5 mg at 10/29/22 1610   atorvastatin (LIPITOR) tablet 40 mg  40 mg Oral Daily West Bali, PA-C   40 mg at 10/29/22 9604   bacitracin-polymyxin b (POLYSPORIN) ophthalmic ointment   Right Eye QID Jerald Kief, MD   Given at 10/29/22 0908   docusate sodium (COLACE) capsule 100 mg  100 mg Oral BID West Bali, PA-C   100 mg at 10/29/22 0903   donepezil (ARICEPT) tablet 10 mg  10 mg Oral QHS Harold Hedge, MD   10 mg at 10/28/22 2115   memantine (NAMENDA) tablet 10 mg  10 mg Oral Daily Harold Hedge, MD   10 mg at 10/29/22 0903   methocarbamol (ROBAXIN) tablet 500 mg  500 mg Oral Q6H PRN West Bali, PA-C   500 mg at 10/28/22 1559   Or   methocarbamol (ROBAXIN) 500 mg in  dextrose 5 % 50 mL IVPB  500 mg Intravenous Q6H PRN Sharon Seller, Sarah A, PA-C       metoCLOPramide (REGLAN) tablet 5-10 mg  5-10 mg Oral Q8H PRN Sharon Seller, Sarah A, PA-C       Or   metoCLOPramide (REGLAN) injection 5-10 mg  5-10 mg Intravenous Q8H PRN McClung, Sarah A, PA-C       mirtazapine (REMERON) tablet 7.5 mg  7.5 mg Oral QHS Harold Hedge, MD   7.5 mg at 10/28/22 2115   morphine (PF) 2 MG/ML injection  0.5-1 mg  0.5-1 mg Intravenous Q2H PRN West Bali, PA-C   1 mg at 10/25/22 0211   ondansetron (ZOFRAN) tablet 4 mg  4 mg Oral Q6H PRN West Bali, PA-C       Or   ondansetron (ZOFRAN) injection 4 mg  4 mg Intravenous Q6H PRN West Bali, PA-C   4 mg at 10/24/22 1342   oxyCODONE-acetaminophen (PERCOCET/ROXICET) 5-325 MG per tablet 1 tablet  1 tablet Oral Q6H PRN West Bali, PA-C   1 tablet at 10/29/22 1040   [START ON 10/30/2022] pantoprazole (PROTONIX) EC tablet 40 mg  40 mg Oral Daily Jenel Lucks, MD       polyethylene glycol (MIRALAX / GLYCOLAX) packet 17 g  17 g Oral BID Jerald Kief, MD   17 g at 10/29/22 1610   senna (SENOKOT) tablet 8.6 mg  1 tablet Oral Daily Jerald Kief, MD   8.6 mg at 10/29/22 9604    Allergies as of 10/23/2022 - Review Complete 10/30/2015  Allergen Reaction Noted   Lunesta [eszopiclone] Shortness Of Breath 10/30/2015   Prednisone Anxiety 12/10/2014   Penicillins Rash 12/10/2014    History reviewed. No pertinent family history.  Social History   Socioeconomic History   Marital status: Married    Spouse name: Not on file   Number of children: Not on file   Years of education: Not on file   Highest education level: Not on file  Occupational History   Not on file  Tobacco Use   Smoking status: Never   Smokeless tobacco: Not on file  Substance and Sexual Activity   Alcohol use: No    Alcohol/week: 0.0 standard drinks of alcohol   Drug use: No   Sexual activity: Not on file  Other Topics Concern   Not on file   Social History Narrative   Not on file   Social Determinants of Health   Financial Resource Strain: Not on file  Food Insecurity: No Food Insecurity (10/24/2022)   Hunger Vital Sign    Worried About Running Out of Food in the Last Year: Never true    Ran Out of Food in the Last Year: Never true  Transportation Needs: No Transportation Needs (10/24/2022)   PRAPARE - Administrator, Civil Service (Medical): No    Lack of Transportation (Non-Medical): No  Physical Activity: Not on file  Stress: Not on file  Social Connections: Not on file  Intimate Partner Violence: Not At Risk (10/24/2022)   Humiliation, Afraid, Rape, and Kick questionnaire    Fear of Current or Ex-Partner: No    Emotionally Abused: No    Physically Abused: No    Sexually Abused: No    Review of Systems: Pertinent positive and negative review of systems were noted in the above HPI section.  All other review of systems was otherwise negative.   Physical Exam: Vital signs in last 24 hours: Temp:  [98.5 F (36.9 C)-98.9 F (37.2 C)] 98.5 F (36.9 C) (07/12 0819) Pulse Rate:  [98-109] 109 (07/12 0518) Resp:  [15-18] 17 (07/12 0819) BP: (102-128)/(42-72) 117/72 (07/12 1047) SpO2:  [92 %-94 %] 94 % (07/12 0819) Last BM Date : 10/29/22 General:   Alert,  Well-developed, well-nourished early white female, pleasant and cooperative in NAD Head:  Normocephalic and atraumatic. Eyes:  Sclera clear, no icterus.   Conjunctiva pink. Ears:  Normal auditory acuity. Nose:  No deformity, discharge,  or lesions. Mouth:  No  deformity or lesions.   Neck:  Supple; no masses or thyromegaly. Lungs:  Clear throughout to auscultation.   No wheezes, crackles, or rhonchi. Heart:  Regular rate and rhythm; no murmurs, clicks, rubs,  or gallops. Abdomen:  Soft,nontender, BS active,nonpalp mass or hsm.   Rectal:  not done Msk:  Symmetrical without gross deformities. . Pulses:  Normal pulses noted.  Neurologic:  Alert and   oriented x4;  grossly normal neurologically. Skin:  Intact without significant lesions or rashes.. Psych:  Alert and cooperative. Normal mood and affect.  Intake/Output from previous day: 07/11 0701 - 07/12 0700 In: 360 [P.O.:360] Out: 500 [Urine:500] Intake/Output this shift: Total I/O In: 240 [P.O.:240] Out: -   Lab Results: Recent Labs    10/28/22 0924 10/29/22 0212  WBC 6.0 6.2  HGB 10.6* 11.7*  HCT 32.9* 36.4  PLT 192 217   BMET Recent Labs    10/28/22 0924 10/29/22 0212  NA 138 138  K 4.5 4.7  CL 107 108  CO2 24 22  GLUCOSE 149* 103*  BUN 18 17  CREATININE 1.00 0.90  CALCIUM 8.2* 8.5*   LFT Recent Labs    10/29/22 0212  PROT 5.6*  ALBUMIN 2.0*  AST 36  ALT 9  ALKPHOS 208*  BILITOT 1.6*   PT/INR No results for input(s): "LABPROT", "INR" in the last 72 hours. Hepatitis Panel No results for input(s): "HEPBSAG", "HCVAB", "HEPAIGM", "HEPBIGM" in the last 72 hours.    IMPRESSION:  #60 77 year old white female with history of chronic iron deficiency anemia,felt secondary to chronic GI blood loss. Admitted currently after fall at home and sustained a right hip fracture for which she underwent surgical correction. Noted to be anemic on admission with hemoglobin of 7.8 and has since been documented heme positive.  She was transfused 2 units of packed RBCs after admission and hemoglobin has been stable since, no evidence of overt bleeding  Patient had required outpatient transfusions x 3 over the past several months in [place], has been followed by her PCP and has also had 2 recent iron infusions for the iron deficiency anemia. Iron counts here look good  She had GI evaluation about 2 and half years ago for same in [place] with negative EGD and colonoscopy and at that time had been taken off of Eliquis.  There was some discussion about capsule endoscopy but that was not done. Did well  until she had to resume Eliquis in January when she had a very large  left lower extremity DVT which required an extensive thrombectomy.  #2 dementia 3.  History of prior TIA 4.  Hypertension 5.  History of PSVT   PLAN: Continue to monitor serial hemoglobins And has been on chronic PPI at home/Dexilant, would continue Would not plan any GI evaluation here, her family is in the midst of arranging for her to be admitted to the acute rehab unit at [facility] She is an established patient with Dr. Salvadore Oxford in [place] and they will have her follow-up there.  I think she would benefit from capsule endoscopy in [place] as an outpatient.  Patient's daughter was appreciative of our care, GI will be available if needed, will sign off.    Caydan Mctavish EsterwoodPA-C  10/29/2022, 12:55 PM

## 2022-10-29 NOTE — Progress Notes (Signed)
  Progress Note   Patient: Ebony Olson ZOX:[id number] DOB: 04-13-1946 DOA: 10/24/2022     5 DOS: the patient was seen and examined on 10/29/2022   Brief hospital course: 77 y.o. female with medical history significant of moderate dementia, left lower extremity DVT sp thrombectomy (04/2022), pulmonary embolism, hypertension, PSVT, TIA, chronic anemia and osteoporosis who presented after a mechanical fall.  Right leg intertrochanteric fracture, status post cephalomedullary nail fixation.  Patient has symptomatic anemia, was on Eliquis which is held.  Status post transfusion of 2 units.  Hemoglobin is in the range of 12.  Continue to monitor closely.  Patient working with physical therapy will most likely need rehab.   Assessment and Plan:  Closed right hip fracture, initial encounter [facility])  Patient underwent cephalomedullary nail fixation today's postoperative day 2. Continue pain control with morphine and oxycodone.  Continued on eliquis  PT/OT, recs for SNF   Hypoxia :  -resolved, now on room air   Symptomatic anemia Patient has received 2 units prior to transfer to [facility] with appropriate response.  Pt required 2 more units PRBC's 7/7 Currently on apixaban. Occult stool is pos, not grossly bloody or melanotic. -hgb remains stable -Appreciate input by GI. Recs for outpatient capsule study with her primary GI physician after pt recovers from hip fx   Dyslipidemia Continue statin therapy.   DVT (deep venous thrombosis) (HCC) Chronic deep vein thrombosis.  Continued on eliquis   Dementia (HCC) Continue clonazepam and nortriptyline.   GERD (gastroesophageal reflux disease) Resume proton pump  inhibitor.    Conjunctivitis -Recently reported itchy R eye that seems matted down in the AM.  -Improvement noted with polymyxin eye ointment  Subjective: Pleasant without complaints  Physical Exam: Vitals:   10/28/22 1931 10/29/22 0518 10/29/22 0819 10/29/22 1047  BP: (!) 122/51 128/64 (!)  118/42 117/72  Pulse: (!) 101 (!) 109    Resp: 15 17 17    Temp: 98.9 F (37.2 C)  98.5 F (36.9 C)   TempSrc: Oral  Oral   SpO2: 92% 94% 94%   Weight:      Height:       General exam: Awake, laying in bed, in nad Respiratory system: Normal respiratory effort, no wheezing Cardiovascular system: regular rate, s1, s2 Gastrointestinal system: Soft, nondistended, positive BS Central nervous system: CN2-12 grossly intact, strength intact Extremities: Perfused, no clubbing Skin: Normal skin turgor, no notable skin lesions seen Psychiatry: Mood normal // no visual hallucinations   Data Reviewed:  Labs reviewed: Na 138, K 4.7, Cr 0.90, WBC 6.2, Hgb 11.7  Family Communication: Pt in room, family at bedside  Disposition: Status is: Inpatient Remains inpatient appropriate because: Severity of illness  Planned Discharge Destination: Skilled nursing facility    Author: Rickey Barbara, MD 10/29/2022 5:00 PM  For on call review [email or web address].

## 2022-10-29 NOTE — TOC Progression Note (Signed)
Transition of Care [facility]) - Progression Note    Patient Details  Name: Ebony Olson MRN: [id number] Date of Birth: Nov 05, 1945  Transition of Care [facility]) CM/SW Contact  Erin Sons, [place] Phone Number: 10/29/2022, 10:10 AM  Clinical Narrative:     [facility] requesting recent clinicals; CSW faxed clinicals.   CSW updated pt's daughter bedside.   Expected Discharge Plan: IP Rehab Facility (IR vs SNF) Barriers to Discharge: Continued Medical Work up, Conservator, museum/gallery and Services                                               Social Determinants of Health (SDOH) Interventions SDOH Screenings   Food Insecurity: No Food Insecurity (10/24/2022)  Housing: Low Risk  (10/24/2022)  Transportation Needs: No Transportation Needs (10/24/2022)  Utilities: Not At Risk (10/24/2022)  Tobacco Use: Unknown (10/24/2022)    Readmission Risk Interventions     No data to display

## 2022-10-29 NOTE — Progress Notes (Signed)
Physical Therapy Treatment Patient Details Name: Ebony Olson MRN: [id number] DOB: 1946/03/09 Today's Date: 10/29/2022   History of Present Illness 77 y.o. female with medical history significant of moderate dementia, left lower extremity DVT sp thrombectomy (04/2022), pulmonary embolism, hypertension, PSVT, TIA, chronic anemia and osteoporosis who presented after a mechanical fall sustaining a closed right hip fracture. 7/7 underwent cephalomedullary nailing of her right hip.    PT Comments  Pt with similar presentation to previous sessions. No change in DC/DME recs at this time. PT will continue to follow.     Assistance Recommended at Discharge Frequent or constant Supervision/Assistance  If plan is discharge home, recommend the following:  Can travel by private vehicle    Two people to help with walking and/or transfers;A lot of help with bathing/dressing/bathroom;Assistance with cooking/housework;Direct supervision/assist for medications management;Direct supervision/assist for financial management;Assist for transportation;Help with stairs or ramp for entrance   No  Equipment Recommendations  Other (comment) (Per accepting facility)    Recommendations for Other Services       Precautions / Restrictions Precautions Precautions: Fall Precaution Comments: baseline LLE weakness. dementia Restrictions Weight Bearing Restrictions: Yes RLE Weight Bearing: Weight bearing as tolerated     Mobility  Bed Mobility Overal bed mobility: Needs Assistance Bed Mobility: Supine to Sit     Supine to sit: Total assist, +2 for safety/equipment, +2 for physical assistance, HOB elevated     General bed mobility comments: Helicopter method to EOB using bed pads    Transfers Overall transfer level: Needs assistance Equipment used: 2 person hand held assist Transfers: Sit to/from Stand, Bed to chair/wheelchair/BSC Sit to Stand: Total assist, +2 physical assistance, +2 safety/equipment,  From elevated surface Stand pivot transfers: Total assist, +2 physical assistance, +2 safety/equipment, From elevated surface         General transfer comment: Total assist to chair. Pt unable to take any steps. Maxi sky lift placed in chair    Ambulation/Gait               General Gait Details: Pt unable   Stairs             Wheelchair Mobility     Tilt Bed    Modified Rankin (Stroke Patients Only)       Balance Overall balance assessment: Needs assistance Sitting-balance support: Bilateral upper extremity supported, Feet supported Sitting balance-Leahy Scale: Poor Sitting balance - Comments: sitting EOB Postural control: Posterior lean Standing balance support: Bilateral upper extremity supported, During functional activity, Reliant on assistive device for balance Standing balance-Leahy Scale: Zero Standing balance comment: Reliant on +2 HHA                            Cognition Arousal/Alertness: Awake/alert Behavior During Therapy: Anxious Overall Cognitive Status: History of cognitive impairments - at baseline                                 General Comments: Pt repeating "please don't hurt me" even when not doing any mobility. Pt with high FOF        Exercises      General Comments General comments (skin integrity, edema, etc.): VSS on RA      Pertinent Vitals/Pain Pain Assessment Pain Assessment: Faces Faces Pain Scale: Hurts worst Pain Location: right hip during any movement and mobility Pain Descriptors / Indicators: Grimacing, Guarding, Other (Comment), Aching,  Moaning (Yelling) Pain Intervention(s): Monitored during session, Limited activity within patient's tolerance, RN gave pain meds during session    Home Living                          Prior Function            PT Goals (current goals can now be found in the care plan section) Progress towards PT goals: Progressing toward goals     Frequency    Min 3X/week      PT Plan Current plan remains appropriate    Co-evaluation              AM-PAC PT "6 Clicks" Mobility   Outcome Measure  Help needed turning from your back to your side while in a flat bed without using bedrails?: Total Help needed moving from lying on your back to sitting on the side of a flat bed without using bedrails?: Total Help needed moving to and from a bed to a chair (including a wheelchair)?: Total Help needed standing up from a chair using your arms (e.g., wheelchair or bedside chair)?: Total Help needed to walk in hospital room?: Total Help needed climbing 3-5 steps with a railing? : Total 6 Click Score: 6    End of Session Equipment Utilized During Treatment: Gait belt Activity Tolerance: Patient limited by pain;Other (comment) (Anxiety and fear of falling) Patient left: in chair;with call bell/phone within reach;with chair alarm set;with family/visitor present Nurse Communication: Mobility status;Need for lift equipment PT Visit Diagnosis: Other abnormalities of gait and mobility (R26.89)     Time: 1610-9604 PT Time Calculation (min) (ACUTE ONLY): 12 min  Charges:    $Therapeutic Activity: 8-22 mins PT General Charges $$ ACUTE PT VISIT: 1 Visit                     Shela Nevin, PT, DPT Acute Rehab Services [phone]    Gladys Damme 10/29/2022, 11:52 AM

## 2022-10-29 NOTE — Plan of Care (Signed)

## 2022-10-30 DIAGNOSIS — S72001A Fracture of unspecified part of neck of right femur, initial encounter for closed fracture: Secondary | ICD-10-CM | POA: Diagnosis not present

## 2022-10-30 LAB — CBC
HCT: 32 % — ABNORMAL LOW (ref 36.0–46.0)
Hemoglobin: 10.2 g/dL — ABNORMAL LOW (ref 12.0–15.0)
MCH: 31.2 pg (ref 26.0–34.0)
MCHC: 31.9 g/dL (ref 30.0–36.0)
MCV: 97.9 fL (ref 80.0–100.0)
Platelets: 226 10*3/uL (ref 150–400)
RBC: 3.27 MIL/uL — ABNORMAL LOW (ref 3.87–5.11)
RDW: 19.6 % — ABNORMAL HIGH (ref 11.5–15.5)
WBC: 6.7 10*3/uL (ref 4.0–10.5)
nRBC: 0 % (ref 0.0–0.2)

## 2022-10-30 NOTE — Plan of Care (Signed)

## 2022-10-30 NOTE — Plan of Care (Signed)
  Problem: Clinical Measurements: Goal: Will remain free from infection Outcome: Progressing   Problem: Nutrition: Goal: Adequate nutrition will be maintained Outcome: Progressing   Problem: Elimination: Goal: Will not experience complications related to bowel motility Outcome: Progressing Goal: Will not experience complications related to urinary retention Outcome: Progressing   Problem: Safety: Goal: Ability to remain free from injury will improve Outcome: Progressing

## 2022-10-30 NOTE — Progress Notes (Signed)
  Progress Note   Patient: Ebony Olson [id number]:[id number] DOB: 05-Jun-1945 DOA: 10/24/2022     6 DOS: the patient was seen and examined on 10/30/2022   Brief hospital course: 77 y.o. female with medical history significant of moderate dementia, left lower extremity DVT sp thrombectomy (04/2022), pulmonary embolism, hypertension, PSVT, TIA, chronic anemia and osteoporosis who presented after a mechanical fall.  Right leg intertrochanteric fracture, status post cephalomedullary nail fixation.  Patient has symptomatic anemia, was on Eliquis which is held.  Status post transfusion of 2 units.  Hemoglobin is in the range of 12.  Continue to monitor closely.  Patient working with physical therapy will most likely need rehab.   Assessment and Plan:  Closed right hip fracture, initial encounter [facility])  Patient underwent cephalomedullary nail fixation today's postoperative day 2. Continue pain control with morphine and oxycodone.  Continued on eliquis  PT/OT, recs for SNF vs ip rehab. TOC following   Hypoxia :  -resolved, now on room air   Symptomatic anemia Patient has received 2 units prior to transfer to [facility] with appropriate response.  Pt required 2 more units PRBC's 7/7 Currently on apixaban. Occult stool is pos, not grossly bloody or melanotic. -Appreciate input by GI. Recs for outpatient capsule study with her primary GI physician after pt recovers from hip fx -Hgb remains stable   Dyslipidemia Continue statin therapy.   DVT (deep venous thrombosis) (HCC) Chronic deep vein thrombosis.  Continued on eliquis   Dementia (HCC) Continue clonazepam and nortriptyline.   GERD (gastroesophageal reflux disease) Resume proton pump  inhibitor.    Conjunctivitis -Recently reported itchy R eye that seems matted down in the AM.  -Improvement was noted with polymyxin eye ointment  Subjective: No complaints this AM  Physical Exam: Vitals:   10/29/22 1740 10/29/22 1940 10/30/22 0431 10/30/22 0821   BP: (!) 95/58 128/63 (!) 117/53 (!) 109/53  Pulse:   98 (!) 101  Resp: 18 16 17 17   Temp: 99 F (37.2 C) 98.8 F (37.1 C) 98.8 F (37.1 C) 98.6 F (37 C)  TempSrc: Oral     SpO2: 93% 94% 91% 93%  Weight:      Height:       General exam: Conversant, in no acute distress Respiratory system: normal chest rise, clear, no audible wheezing Cardiovascular system: regular rhythm, s1-s2 Gastrointestinal system: Nondistended, nontender, pos BS Central nervous system: No seizures, no tremors Extremities: No cyanosis, no joint deformities Skin: No rashes, no pallor Psychiatry: Affect normal // no auditory hallucinations   Data Reviewed:  Labs reviewed: WBC 6.7, Hgb 10.2, Plts 226  Family Communication: Pt in room, family at bedside  Disposition: Status is: Inpatient Remains inpatient appropriate because: Severity of illness  Planned Discharge Destination: Skilled nursing facility    Author: Rickey Barbara, MD 10/30/2022 5:17 PM  For on call review [email or web address].

## 2022-10-31 DIAGNOSIS — S72001A Fracture of unspecified part of neck of right femur, initial encounter for closed fracture: Secondary | ICD-10-CM | POA: Diagnosis not present

## 2022-10-31 LAB — CBC
HCT: 30.5 % — ABNORMAL LOW (ref 36.0–46.0)
Hemoglobin: 9.7 g/dL — ABNORMAL LOW (ref 12.0–15.0)
MCH: 32.3 pg (ref 26.0–34.0)
MCHC: 31.8 g/dL (ref 30.0–36.0)
MCV: 101.7 fL — ABNORMAL HIGH (ref 80.0–100.0)
Platelets: 234 10*3/uL (ref 150–400)
RBC: 3 MIL/uL — ABNORMAL LOW (ref 3.87–5.11)
RDW: 19.4 % — ABNORMAL HIGH (ref 11.5–15.5)
WBC: 6.3 10*3/uL (ref 4.0–10.5)
nRBC: 0 % (ref 0.0–0.2)

## 2022-10-31 LAB — COMPREHENSIVE METABOLIC PANEL
ALT: 16 U/L (ref 0–44)
AST: 53 U/L — ABNORMAL HIGH (ref 15–41)
Albumin: 1.8 g/dL — ABNORMAL LOW (ref 3.5–5.0)
Alkaline Phosphatase: 186 U/L — ABNORMAL HIGH (ref 38–126)
Anion gap: 6 (ref 5–15)
BUN: 17 mg/dL (ref 8–23)
CO2: 25 mmol/L (ref 22–32)
Calcium: 8.5 mg/dL — ABNORMAL LOW (ref 8.9–10.3)
Chloride: 108 mmol/L (ref 98–111)
Creatinine, Ser: 0.9 mg/dL (ref 0.44–1.00)
GFR, Estimated: >60 mL/min (ref >60)
Glucose, Bld: 98 mg/dL (ref 70–99)
Potassium: 3.9 mmol/L (ref 3.5–5.1)
Sodium: 139 mmol/L (ref 135–145)
Total Bilirubin: 1.1 mg/dL (ref 0.3–1.2)
Total Protein: 5 g/dL — ABNORMAL LOW (ref 6.5–8.1)

## 2022-10-31 MED ORDER — VITAMIN B-12 1000 MCG PO TABS
500.0000 ug | ORAL_TABLET | Freq: Every day | ORAL | Status: DC
  Administered 2022-10-31 – 2022-11-03 (×4): 500 ug via ORAL
  Filled 2022-10-31 (×4): qty 1

## 2022-10-31 NOTE — Progress Notes (Signed)
  Progress Note   Patient: Ebony Olson [id number] DOB: Aug 27, 1945 DOA: 10/24/2022     7 DOS: the patient was seen and examined on 10/31/2022   Brief hospital course: 77 y.o. female with medical history significant of moderate dementia, left lower extremity DVT sp thrombectomy (04/2022), pulmonary embolism, hypertension, PSVT, TIA, chronic anemia and osteoporosis who presented after a mechanical fall.  Right leg intertrochanteric fracture, status post cephalomedullary nail fixation.  Patient has symptomatic anemia, was on Eliquis which is held.  Status post transfusion of 2 units.  Hemoglobin is in the range of 12.  Continue to monitor closely.  Patient working with physical therapy will most likely need rehab.   Assessment and Plan:  Closed right hip fracture, initial encounter [facility])  Patient underwent cephalomedullary nail fixation today's postoperative day 2. Continue pain control with morphine and oxycodone.  Continued on eliquis  PT/OT, recs for SNF vs ip rehab. TOC continues to follow   Hypoxia :  -resolved, now on room air   Symptomatic anemia Patient has received 2 units prior to transfer to [facility] with appropriate response.  Pt required 2 more units PRBC's 7/7 Currently on apixaban. Occult stool is pos, not grossly bloody or melanotic. -Appreciate input by GI. Recs for outpatient capsule study with her primary GI physician after pt recovers from hip fx -Hgb remains stable   Dyslipidemia Continue statin therapy.   DVT (deep venous thrombosis) (HCC) Chronic deep vein thrombosis.  Continued on eliquis   Dementia (HCC) Continue clonazepam and nortriptyline.   GERD (gastroesophageal reflux disease) Resume proton pump  inhibitor.    Conjunctivitis -Recently reported itchy R eye that seems matted down in the AM.  -Improvement was noted with polymyxin eye ointment  Subjective: without complaints this AM  Physical Exam: Vitals:   10/30/22 1939 10/31/22 0412 10/31/22 0741  10/31/22 1546  BP: (!) 128/59 119/60 127/65 113/64  Pulse: 100 92 94 95  Resp: 17 16 17 17   Temp: 98.7 F (37.1 C)  98.4 F (36.9 C) 98.8 F (37.1 C)  TempSrc: Oral     SpO2: 91% (!) 88% 92% 91%  Weight:      Height:       General exam: Awake, laying in bed, in nad Respiratory system: Normal respiratory effort, no wheezing Cardiovascular system: regular rate, s1, s2 Gastrointestinal system: Soft, nondistended, positive BS Central nervous system: CN2-12 grossly intact, strength intact Extremities: Perfused, no clubbing Skin: Normal skin turgor, no notable skin lesions seen Psychiatry: Mood normal // no visual hallucinations   Data Reviewed:  Labs reviewed: Na 139, K 3.9, Cr 0.90, WBC 6.3, Hgb 9.7, Plts 234  Family Communication: Pt in room, family at bedside  Disposition: Status is: Inpatient Remains inpatient appropriate because: Severity of illness  Planned Discharge Destination: Skilled nursing facility    Author: Rickey Barbara, MD 10/31/2022 4:33 PM  For on call review [email or web address].

## 2022-11-01 DIAGNOSIS — S72001A Fracture of unspecified part of neck of right femur, initial encounter for closed fracture: Secondary | ICD-10-CM | POA: Diagnosis not present

## 2022-11-01 LAB — COMPREHENSIVE METABOLIC PANEL
ALT: 20 U/L (ref 0–44)
AST: 57 U/L — ABNORMAL HIGH (ref 15–41)
Albumin: 1.9 g/dL — ABNORMAL LOW (ref 3.5–5.0)
Alkaline Phosphatase: 178 U/L — ABNORMAL HIGH (ref 38–126)
Anion gap: 9 (ref 5–15)
BUN: 15 mg/dL (ref 8–23)
CO2: 25 mmol/L (ref 22–32)
Calcium: 8.8 mg/dL — ABNORMAL LOW (ref 8.9–10.3)
Chloride: 106 mmol/L (ref 98–111)
Creatinine, Ser: 0.91 mg/dL (ref 0.44–1.00)
GFR, Estimated: >60 mL/min (ref >60)
Glucose, Bld: 110 mg/dL — ABNORMAL HIGH (ref 70–99)
Potassium: 4.2 mmol/L (ref 3.5–5.1)
Sodium: 140 mmol/L (ref 135–145)
Total Bilirubin: 1.1 mg/dL (ref 0.3–1.2)
Total Protein: 5.2 g/dL — ABNORMAL LOW (ref 6.5–8.1)

## 2022-11-01 LAB — CBC
HCT: 33.9 % — ABNORMAL LOW (ref 36.0–46.0)
Hemoglobin: 10.7 g/dL — ABNORMAL LOW (ref 12.0–15.0)
MCH: 31.8 pg (ref 26.0–34.0)
MCHC: 31.6 g/dL (ref 30.0–36.0)
MCV: 100.6 fL — ABNORMAL HIGH (ref 80.0–100.0)
Platelets: 285 10*3/uL (ref 150–400)
RBC: 3.37 MIL/uL — ABNORMAL LOW (ref 3.87–5.11)
RDW: 19.2 % — ABNORMAL HIGH (ref 11.5–15.5)
WBC: 5.7 10*3/uL (ref 4.0–10.5)
nRBC: 0 % (ref 0.0–0.2)

## 2022-11-01 NOTE — Progress Notes (Signed)
  Progress Note   Patient: Ebony Olson [id number] DOB: 1945/08/19 DOA: 10/24/2022     8 DOS: the patient was seen and examined on 11/01/2022   Brief hospital course: 77 y.o. female with medical history significant of moderate dementia, left lower extremity DVT sp thrombectomy (04/2022), pulmonary embolism, hypertension, PSVT, TIA, chronic anemia and osteoporosis who presented after a mechanical fall.  Right leg intertrochanteric fracture, status post cephalomedullary nail fixation.  Patient has symptomatic anemia, was on Eliquis which is held.  Status post transfusion of 2 units.  Hemoglobin is in the range of 12.  Continue to monitor closely.  Patient working with physical therapy will most likely need rehab.   Assessment and Plan:  Closed right hip fracture, initial encounter [facility])  Patient underwent cephalomedullary nail fixation today's postoperative day 2. Continue pain control with morphine and oxycodone.  Continued on eliquis  PT/OT, recs for SNF vs ip rehab. TOC continues to follow   Hypoxia :  -resolved, now on room air   Symptomatic anemia Patient has received 2 units prior to transfer to [facility] with appropriate response.  Pt required 2 more units PRBC's 7/7 Currently on apixaban. Occult stool is pos, not grossly bloody or melanotic. -Appreciate input by GI. Recs for outpatient capsule study with her primary GI physician after pt recovers from hip fx -Hgb currently stable   Dyslipidemia Continue statin therapy.   DVT (deep venous thrombosis) (HCC) Chronic deep vein thrombosis.  Continued on eliquis   Dementia (HCC) Continue clonazepam and nortriptyline.   GERD (gastroesophageal reflux disease) Resume proton pump  inhibitor.    Conjunctivitis -Recently reported itchy R eye that seems matted down in the AM.  -Improvement was noted with polymyxin eye ointment  Subjective: In good spirits without complaints when seen. Pt reports earlier in AM, had bout of nausea after  eating breakfast that resolved with zofran  Physical Exam: Vitals:   11/01/22 0424 11/01/22 0722 11/01/22 1009 11/01/22 1440  BP: (!) 120/56 (!) 117/56 (!) 101/52 (!) 114/56  Pulse: 89 88  94  Resp: 18     Temp: 98.2 F (36.8 C) 98.1 F (36.7 C)  98.4 F (36.9 C)  TempSrc: Oral Oral  Oral  SpO2: 94% 93%  95%  Weight:      Height:       General exam: Conversant, in no acute distress Respiratory system: normal chest rise, clear, no audible wheezing Cardiovascular system: regular rhythm, s1-s2 Gastrointestinal system: Nondistended, nontender, pos BS Central nervous system: No seizures, no tremors Extremities: No cyanosis, no joint deformities Skin: No rashes, no pallor Psychiatry: Affect normal // no auditory hallucinations   Data Reviewed:  Labs reviewed: Na 140, K 4.2, Cr 0.91, hgb 10.7, WBC 5.7  Family Communication: Pt in room, family at bedside  Disposition: Status is: Inpatient Remains inpatient appropriate because: Severity of illness  Planned Discharge Destination: Skilled nursing facility    Author: Rickey Barbara, MD 11/01/2022 7:02 PM  For on call review [email or web address].

## 2022-11-01 NOTE — TOC Progression Note (Addendum)
Transition of Care [facility]) - Progression Note    Patient Details  Name: Ebony Olson MRN: [id number] Date of Birth: 1945-10-22  Transition of Care [facility]) CM/SW Contact  Lorri Frederick, LCSW Phone Number: 11/01/2022, 8:40 AM  Clinical Narrative:   CSW LM with [facility] regarding insurance auth.  [id number] [facility].  Auth still pending.   1145: CSW spoke with pt daughter Toniann Fail, updated that auth still pending at [facility].  She is also asking that SNF placement at [facility] and [facility] re[facility] be explored.  Referral faxed to [facility] Referral faxed to [facility], [phone].   CSW LM at both facilities.    1430: TC [facility].  She can extend bed offer.   Expected Discharge Plan: IP Rehab Facility (IR vs SNF) Barriers to Discharge: Continued Medical Work up, Conservator, museum/gallery and Services                                               Social Determinants of Health (SDOH) Interventions SDOH Screenings   Food Insecurity: No Food Insecurity (10/24/2022)  Housing: Low Risk  (10/24/2022)  Transportation Needs: No Transportation Needs (10/24/2022)  Utilities: Not At Risk (10/24/2022)  Tobacco Use: Unknown (10/24/2022)    Readmission Risk Interventions     No data to display

## 2022-11-01 NOTE — Care Management Important Message (Signed)
Important Message  Patient Details  Name: Ebony Olson MRN: [id number] Date of Birth: February 20, 1946   Medicare Important Message Given:  Yes     Sherilyn Banker 11/01/2022, 3:57 PM

## 2022-11-01 NOTE — Plan of Care (Signed)
Patient waiting for transfer to rehab, insurance pending.   Problem: Education: Goal: Knowledge of General Education information will improve Description: Including pain rating scale, medication(s)/side effects and non-pharmacologic comfort measures Outcome: Progressing   Problem: Health Behavior/Discharge Planning: Goal: Ability to manage health-related needs will improve Outcome: Progressing   Problem: Clinical Measurements: Goal: Ability to maintain clinical measurements within normal limits will improve Outcome: Progressing Goal: Will remain free from infection Outcome: Progressing Goal: Diagnostic test results will improve Outcome: Progressing Goal: Respiratory complications will improve Outcome: Progressing Goal: Cardiovascular complication will be avoided Outcome: Progressing   Problem: Activity: Goal: Risk for activity intolerance will decrease Outcome: Progressing   Problem: Coping: Goal: Level of anxiety will decrease Outcome: Progressing   Problem: Nutrition: Goal: Adequate nutrition will be maintained Outcome: Progressing   Problem: Elimination: Goal: Will not experience complications related to bowel motility Outcome: Progressing Goal: Will not experience complications related to urinary retention Outcome: Progressing   Problem: Safety: Goal: Ability to remain free from injury will improve Outcome: Progressing   Problem: Skin Integrity: Goal: Risk for impaired skin integrity will decrease Outcome: Progressing

## 2022-11-01 NOTE — NC FL2 (Signed)
[place] MEDICAID FL2 LEVEL OF CARE FORM     IDENTIFICATION  Patient Name: Ebony Olson Birthdate: 02/18/1946 Sex: female Admission Date (Current Location): 10/24/2022  [facility] and [place] Number:  Producer, television/film/video and Address:  [facility], [address] [place], [place] [address]      Provider Number: [id number]  Attending Physician Name and Address:  Jerald Kief, MD  Relative Name and Phone Number:  Keah, Lamba [phone]    Current Level of Care: Hospital Recommended Level of Care: Skilled Nursing Facility Prior Approval Number:    Date Approved/Denied:   PASRR Number:    Discharge Plan: SNF    Current Diagnoses: Patient Active Problem List   Diagnosis Date Noted   Closed right hip fracture, initial encounter (HCC) 10/24/2022   Symptomatic anemia 10/24/2022   Dyslipidemia 10/24/2022   DVT (deep venous thrombosis) (HCC) 10/24/2022   Dementia (HCC) 10/24/2022   GERD (gastroesophageal reflux disease) 10/24/2022   Scoliosis (and kyphoscoliosis), idiopathic 12/10/2014   Osteoporosis 12/10/2014   Thrombophlebitis 12/10/2014   High cholesterol 12/10/2014    Orientation RESPIRATION BLADDER Height & Weight     Self, Time, Situation, Place  Normal Incontinent Weight: 161 lb 6 oz (73.2 kg) Height:  5' 7.01" (170.2 cm)  BEHAVIORAL SYMPTOMS/MOOD NEUROLOGICAL BOWEL NUTRITION STATUS      Incontinent Diet (see discharge summary)  AMBULATORY STATUS COMMUNICATION OF NEEDS Skin   Total Care Verbally Surgical wounds                       Personal Care Assistance Level of Assistance  Bathing, Feeding, Dressing, Total care Bathing Assistance: Maximum assistance Feeding assistance: Maximum assistance Dressing Assistance: Maximum assistance Total Care Assistance: Maximum assistance   Functional Limitations Info  Sight, Hearing, Speech Sight Info: Adequate Hearing Info: Adequate Speech Info: Adequate    SPECIAL CARE  FACTORS FREQUENCY  PT (By licensed PT), OT (By licensed OT)     PT Frequency: 5x week OT Frequency: 5x week            Contractures Contractures Info: Not present    Additional Factors Info  Code Status, Allergies Code Status Info: full Allergies Info: Lunesta (Eszopiclone), Prednisone, Furosemide, Penicillins           Current Medications (11/01/2022):  This is the current hospital active medication list Current Facility-Administered Medications  Medication Dose Route Frequency Provider Last Rate Last Admin   acetaminophen (TYLENOL) tablet 650 mg  650 mg Oral Q6H PRN [place] Bali, PA-C   650 mg at 10/31/22 1827   Or   acetaminophen (TYLENOL) suppository 650 mg  650 mg Rectal Q6H PRN Thyra Breed A, PA-C       albuterol (PROVENTIL) (2.5 MG/3ML) 0.083% nebulizer solution 2.5 mg  2.5 mg Inhalation Q6H PRN Sharon Seller, Sarah A, PA-C       apixaban (ELIQUIS) tablet 5 mg  5 mg Oral BID Ilda Basset, RPH   5 mg at 11/01/22 6440   atorvastatin (LIPITOR) tablet 40 mg  40 mg Oral Daily [place] Bali, PA-C   40 mg at 11/01/22 3474   bacitracin-polymyxin b (POLYSPORIN) ophthalmic ointment   Right Eye QID Jerald Kief, MD   Given at 11/01/22 [phone]   cyanocobalamin (VITAMIN B12) tablet 500 mcg  500 mcg Oral Daily Jerald Kief, MD   500 mcg at 11/01/22 0957   docusate sodium (COLACE) capsule 100 mg  100 mg Oral  BID [place] B[place], PA-C   100 mg at 10/31/22 0630   donepezil (ARICEPT) tablet 10 mg  10 mg Oral QHS Harold Hedge, MD   10 mg at 10/31/22 2215   memantine (NAMENDA) tablet 10 mg  10 mg Oral Daily Harold Hedge, MD   10 mg at 11/01/22 0956   methocarbamol (ROBAXIN) tablet 500 mg  500 mg Oral Q6H PRN [place] B[place], PA-C   500 mg at 10/28/22 1559   Or   methocarbamol (ROBAXIN) 500 mg in dextrose 5 % 50 mL IVPB  500 mg Intravenous Q6H PRN [place] B[place], PA-C       metoCLOPramide (REGLAN) tablet 5-10 mg  5-10 mg Oral Q8H PRN McClung, Sarah A, PA-C        Or   metoCLOPramide (REGLAN) injection 5-10 mg  5-10 mg Intravenous Q8H PRN Thyra Breed A, PA-C   10 mg at 11/01/22 1141   mirtazapine (REMERON) tablet 7.5 mg  7.5 mg Oral QHS Pudota, Kingsley P, MD   7.5 mg at 10/31/22 2215   morphine (PF) 2 MG/ML injection 0.5-1 mg  0.5-1 mg Intravenous Q2H PRN [place] B[place], PA-C   1 mg at 10/25/22 0211   ondansetron (ZOFRAN) tablet 4 mg  4 mg Oral Q6H PRN [place] Bali, PA-C       Or   ondansetron (ZOFRAN) injection 4 mg  4 mg Intravenous Q6H PRN [place] B[place], PA-C   4 mg at 11/01/22 0955   oxyCODONE-acetaminophen (PERCOCET/ROXICET) 5-325 MG per tablet 1 tablet  1 tablet Oral Q6H PRN [place] B[place], PA-C   1 tablet at 10/29/22 1040   pantoprazole (PROTONIX) EC tablet 40 mg  40 mg Oral Daily Tiajuana Amass E, MD   40 mg at 11/01/22 0956   polyethylene glycol (MIRALAX / GLYCOLAX) packet 17 g  17 g Oral BID Jerald Kief, MD   17 g at 10/31/22 1601   senna (SENOKOT) tablet 8.6 mg  1 tablet Oral Daily Jerald Kief, MD   8.6 mg at 10/31/22 0932     Discharge Medications: Please see discharge summary for a list of discharge medications.  Relevant Imaging Results:  Relevant Lab Results:   Additional Information SSN: [id number]  Lorri Frederick, LCSW

## 2022-11-01 NOTE — Plan of Care (Addendum)
Pressure injury on spine, blue purple non-blanchable, foam applied   Problem: Education: Goal: Knowledge of General Education information will improve Description: Including pain rating scale, medication(s)/side effects and non-pharmacologic comfort measures Outcome: Progressing   Problem: Pain Managment: Goal: General experience of comfort will improve Outcome: Progressing   Problem: Safety: Goal: Ability to remain free from injury will improve Outcome: Progressing   Problem: Skin Integrity: Goal: Risk for impaired skin integrity will decrease Outcome: Progressing

## 2022-11-01 NOTE — Progress Notes (Signed)
PT Cancellation Note  Patient Details Name: KORAYMA HAGWOOD MRN: [id number] DOB: 1945/09/28   Cancelled Treatment:    Reason Eval/Treat Not Completed: Patient declined, no reason specified (Pt daughter reported that pt was feeling nauseous and needed to be cleaned up. Likely not able to participate in therapy today. Will follow up later if time allows.)   Gladys Damme 11/01/2022, 11:58 AM

## 2022-11-02 DIAGNOSIS — S72001A Fracture of unspecified part of neck of right femur, initial encounter for closed fracture: Secondary | ICD-10-CM | POA: Diagnosis not present

## 2022-11-02 LAB — CBC
HCT: 33.6 % — ABNORMAL LOW (ref 36.0–46.0)
Hemoglobin: 10.3 g/dL — ABNORMAL LOW (ref 12.0–15.0)
MCH: 31.2 pg (ref 26.0–34.0)
MCHC: 30.7 g/dL (ref 30.0–36.0)
MCV: 101.8 fL — ABNORMAL HIGH (ref 80.0–100.0)
Platelets: 307 10*3/uL (ref 150–400)
RBC: 3.3 MIL/uL — ABNORMAL LOW (ref 3.87–5.11)
RDW: 18.7 % — ABNORMAL HIGH (ref 11.5–15.5)
WBC: 6.6 10*3/uL (ref 4.0–10.5)
nRBC: 0 % (ref 0.0–0.2)

## 2022-11-02 NOTE — TOC Progression Note (Signed)
Transition of Care [facility]) - Progression Note    Patient Details  Name: Ebony Olson MRN: [id number] Date of Birth: July 19, 1945  Transition [facility] Care [facility]) CM/SW Contact  Lorri Frederick, LCSW Phone Number: 11/02/2022, 2:59 PM  Clinical Narrative:   CSW received call that [facility] can offer bed.  Spoke with Toniann Fail by phone, she does want to accept this offer.  CSW LM with [facility] to confirm bed.  Need new PT note for insurance auth.  PT notified.   1450: Insurance auth submitted with [facility].  CSW spoke with [employer]--they do not show any pending auth for [facility] inpatient rehab.  They do show the [employer] pending that was just submitted.  CSW spoke with pt and husband in room and updated.  Husband frustrated due to slow pace, asking if pt can DC early tomorrow.  Aware that [facility] will need to be approved.    Expected Discharge Plan: IP Rehab Facility (IR vs SNF) Barriers to Discharge: Continued Medical Work up, Conservator, museum/gallery and Services                                               Social Determinants of Health (SDOH) Interventions SDOH Screenings   Food Insecurity: No Food Insecurity (10/24/2022)  Housing: Low Risk  (10/24/2022)  Transportation Needs: No Transportation Needs (10/24/2022)  Utilities: Not At Risk (10/24/2022)  Tobacco Use: Unknown (10/24/2022)    Readmission Risk Interventions     No data to display

## 2022-11-02 NOTE — Plan of Care (Signed)
  Problem: Education: Goal: Knowledge of General Education information will improve Description: Including pain rating scale, medication(s)/side effects and non-pharmacologic comfort measures Outcome: Progressing   Problem: Health Behavior/Discharge Planning: Goal: Ability to manage health-related needs will improve Outcome: Progressing   Problem: Activity: Goal: Risk for activity intolerance will decrease Outcome: Progressing   

## 2022-11-02 NOTE — Plan of Care (Signed)

## 2022-11-02 NOTE — Progress Notes (Signed)
Physical Therapy Treatment Patient Details Name: Ebony Olson MRN: [id number] DOB: [birth year] Today's Date: 11/02/2022   History of Present Illness 77 y.o. female with medical history significant of moderate dementia, left lower extremity DVT sp thrombectomy (04/2022), pulmonary embolism, hypertension, PSVT, TIA, chronic anemia and osteoporosis who presented after a mechanical fall sustaining a closed right hip fracture. 7/7 underwent cephalomedullary nailing of her right hip.    PT Comments  Pt tolerated treatment well today. Pt demonstrated slight improvement with transfer to chair today requiring +2 Max instead of +2 Total A. No change in DC/DME recs at this time. Pt will continue to follow.    Assistance Recommended at Discharge Frequent or constant Supervision/Assistance  If plan is discharge home, recommend the following:  Can travel by private vehicle    Two people to help with walking and/or transfers;A lot of help with bathing/dressing/bathroom;Assistance with cooking/housework;Direct supervision/assist for medications management;Direct supervision/assist for financial management;Assist for transportation;Help with stairs or ramp for entrance   No  Equipment Recommendations  Other (comment) (Per accepting facility)    Recommendations for Other Services       Precautions / Restrictions Precautions Precautions: Fall Precaution Comments: baseline LLE weakness. dementia Restrictions Weight Bearing Restrictions: Yes RUE Weight Bearing: Weight bearing as tolerated RLE Weight Bearing: Weight bearing as tolerated     Mobility  Bed Mobility Overal bed mobility: Needs Assistance Bed Mobility: Supine to Sit     Supine to sit: Total assist, +2 for safety/equipment, +2 for physical assistance, HOB elevated     General bed mobility comments: Helicopter method to EOB using bed pads. VC to patient to    Transfers Overall transfer level: Needs assistance Equipment used: 2  person hand held assist (2 person face to face transfer technique) Transfers: Sit to/from Stand, Bed to chair/wheelchair/BSC Sit to Stand: Max assist, +2 physical assistance, +2 safety/equipment   Step pivot transfers: Max assist, +2 physical assistance, +2 safety/equipment       General transfer comment: Maxi sky lift placed under pt in chair. VC provided for sequencing and technique.    Ambulation/Gait               General Gait Details: Pt unable   Stairs             Wheelchair Mobility     Tilt Bed    Modified Rankin (Stroke Patients Only)       Balance Overall balance assessment: Needs assistance Sitting-balance support: Single extremity supported, Feet supported Sitting balance-Leahy Scale: Fair Sitting balance - Comments: sitting EOB. Initially required max posterior support d/t lean although with VC, pt was able to self correct and maintain balance without physical assist. Postural control: Posterior lean Standing balance support: Bilateral upper extremity supported, During functional activity, Reliant on assistive device for balance Standing balance-Leahy Scale: Zero Standing balance comment: reliant on 2 person assist for balance                            Cognition Arousal/Alertness: Awake/alert Behavior During Therapy: Anxious Overall Cognitive Status: History of cognitive impairments - at baseline                                 General Comments: Pt repeating "please don't hurt me" even when not doing any mobility. High anxiety related to pain        Exercises  General Comments General comments (skin integrity, edema, etc.): VSS on RA      Pertinent Vitals/Pain Pain Assessment Pain Assessment: Faces Faces Pain Scale: Hurts whole lot Pain Location: right hip during any movement and mobility Pain Descriptors / Indicators: Grimacing, Guarding, Other (Comment), Aching, Moaning (yelling) Pain  Intervention(s): Monitored during session, Limited activity within patient's tolerance, Repositioned    Home Living                          Prior Function            PT Goals (current goals can now be found in the care plan section) Progress towards PT goals: Progressing toward goals    Frequency    Min 3X/week      PT Plan Current plan remains appropriate    Co-evaluation PT/OT/SLP Co-Evaluation/Treatment: Yes Reason for Co-Treatment: To address functional/ADL transfers;For patient/therapist safety;Necessary to address cognition/behavior during functional activity PT goals addressed during session: Mobility/safety with mobility;Proper use of DME OT goals addressed during session: Strengthening/ROM;ADL's and self-care      AM-PAC PT "6 Clicks" Mobility   Outcome Measure  Help needed turning from your back to your side while in a flat bed without using bedrails?: A Lot Help needed moving from lying on your back to sitting on the side of a flat bed without using bedrails?: Total Help needed moving to and from a bed to a chair (including a wheelchair)?: A Lot Help needed standing up from a chair using your arms (e.g., wheelchair or bedside chair)?: Total Help needed to walk in hospital room?: Total Help needed climbing 3-5 steps with a railing? : Total 6 Click Score: 8    End of Session Equipment Utilized During Treatment: Gait belt Activity Tolerance: Patient limited by pain;Other (comment) (Anxiety and high fear of falling) Patient left: in chair;with call bell/phone within reach;with chair alarm set;with family/visitor present Nurse Communication: Mobility status;Need for lift equipment PT Visit Diagnosis: Other abnormalities of gait and mobility (R26.89)     Time: 1610-9604 PT Time Calculation (min) (ACUTE ONLY): 22 min  Charges:    $Therapeutic Activity: 8-22 mins PT General Charges $$ ACUTE PT VISIT: 1 Visit                     Shela Nevin, PT,  DPT Acute Rehab Services [phone]    Gladys Damme 11/02/2022, 12:46 PM

## 2022-11-02 NOTE — Progress Notes (Signed)
  Progress Note   Patient: Ebony Olson YQM:[id number] DOB: 04/07/1946 DOA: 10/24/2022     9 DOS: the patient was seen and examined on 11/02/2022   Brief hospital course: 77 y.o. female with medical history significant of moderate dementia, left lower extremity DVT sp thrombectomy (04/2022), pulmonary embolism, hypertension, PSVT, TIA, chronic anemia and osteoporosis who presented after a mechanical fall.  Right leg intertrochanteric fracture, status post cephalomedullary nail fixation.  Patient has symptomatic anemia, was on Eliquis which is held.  Status post transfusion of 2 units.  Hemoglobin is in the range of 12.  Continue to monitor closely.  Patient working with physical therapy will most likely need rehab.   Assessment and Plan:  Closed right hip fracture, initial encounter [facility])  Patient underwent cephalomedullary nail fixation today's postoperative day 2. Continue pain control with morphine and oxycodone.  Continued on eliquis  PT/OT, recs for SNF vs ip rehab. TOC following for placement   Hypoxia :  -resolved, now on room air   Symptomatic anemia Patient has received 2 units prior to transfer to [facility] with appropriate response.  Pt required 2 more units PRBC's 7/7 Continue on apixaban. Occult stool is pos, not grossly bloody or melanotic. -Appreciate input by GI. Recs for outpatient capsule study with her primary GI physician after pt recovers from hip fx -Hgb remains stable   Dyslipidemia Continue statin therapy.   DVT (deep venous thrombosis) (HCC) Chronic deep vein thrombosis.  Continued on eliquis   Dementia (HCC) Continue clonazepam and nortriptyline.   GERD (gastroesophageal reflux disease) Resume proton pump  inhibitor.    Conjunctivitis -Recently reported itchy R eye that seems matted down in the AM.  -now improved with polymyxin ointment  Subjective: Pleasantly confused. Without complaints. Family reports pt continues to move bowels  Physical Exam: Vitals:    11/01/22 2142 11/02/22 0815 11/02/22 0821 11/02/22 1237  BP: (!) 104/46 (!) 126/14 (!) 111/45 (!) 106/54  Pulse: 90 95 94 97  Resp:  18 16 18   Temp: 98.4 F (36.9 C) 97.6 F (36.4 C) 97.6 F (36.4 C) 98.5 F (36.9 C)  TempSrc: Oral  Oral Oral  SpO2: 92% 95% 93% 96%  Weight:      Height:       General exam: Conversant, in no acute distress Respiratory system: normal chest rise, clear, no audible wheezing Cardiovascular system: regular rhythm, s1-s2 Gastrointestinal system: Nondistended, nontender, pos BS Central nervous system: No seizures, no tremors Extremities: No cyanosis, no joint deformities Skin: No rashes, no pallor Psychiatry: Affect normal // no auditory hallucinations   Data Reviewed:  Labs reviewed: WBC 6.6, Hgb 10.3, 307  Family Communication: Pt in room, family at bedside  Disposition: Status is: Inpatient Remains inpatient appropriate because: Severity of illness  Planned Discharge Destination: Skilled nursing facility    Author: Rickey Barbara, MD 11/02/2022 4:57 PM  For on call review [email or web address].

## 2022-11-02 NOTE — Progress Notes (Signed)
Occupational Therapy Treatment Patient Details Name: Ebony Olson MRN: [id number] DOB: [birth year] Today's Date: 11/02/2022   History of present illness 77 y.o. female with medical history significant of moderate dementia, left lower extremity DVT sp thrombectomy (04/2022), pulmonary embolism, hypertension, PSVT, TIA, chronic anemia and osteoporosis who presented after a mechanical fall sustaining a closed right hip fracture. 7/7 underwent cephalomedullary nailing of her right hip.   OT comments  Nursing assisting pt with self care at bed level upon therapy arrival. Once finished, session focused on bed mobility and functional transfers from bed to recliner. Total Ax2 still required for bed mobility to eliminate increasing pain. Pt was able to assist more during sit to stand and step pivot transfer requiring max X2 versus total Ax2. Used motivation technique to help distract pt from pain and allow her to tolerate functional movement and transfers better. Grandson and Husband waiting outside of room during session. Pt continues to be appropriate for continued inpatient follow up therapy, <3 hours/day. OT will continue to follow patient acutely.    Recommendations for follow up therapy are one component of a multi-disciplinary discharge planning process, led by the attending physician.  Recommendations may be updated based on patient status, additional functional criteria and insurance authorization.    Assistance Recommended at Discharge Frequent or constant Supervision/Assistance  Patient can return home with the following  Two people to help with walking and/or transfers;Two people to help with bathing/dressing/bathroom;Help with stairs or ramp for entrance;Assist for transportation   Equipment Recommendations  Other (comment) (defer to next venue of care)       Precautions / Restrictions Precautions Precautions: Fall Precaution Comments: baseline LLE weakness. dementia Restrictions Weight  Bearing Restrictions: Yes RUE Weight Bearing: Weight bearing as tolerated RLE Weight Bearing: Weight bearing as tolerated       Mobility Bed Mobility Overal bed mobility: Needs Assistance Bed Mobility: Supine to Sit     Supine to sit: Total assist, +2 for safety/equipment, +2 for physical assistance, HOB elevated     General bed mobility comments: Helicopter method to EOB using bed pads. VC to patient to    Transfers Overall transfer level: Needs assistance Equipment used: 2 person hand held assist (2 person face to face transfer technique) Transfers: Sit to/from Stand, Bed to chair/wheelchair/BSC Sit to Stand: Max assist, +2 physical assistance, +2 safety/equipment     Step pivot transfers: Max assist, +2 physical assistance, +2 safety/equipment     General transfer comment: Maxi sky lift placed under pt in chair. VC provided for sequencing and technique.     Balance Overall balance assessment: Needs assistance Sitting-balance support: Single extremity supported, Feet supported Sitting balance-Leahy Scale: Fair Sitting balance - Comments: sitting EOB. Initially required max posterior support d/t lean although with VC, pt was able to self correct and maintain balance without physical assist. Postural control: Posterior lean Standing balance support: Bilateral upper extremity supported, During functional activity, Reliant on assistive device for balance Standing balance-Leahy Scale: Zero Standing balance comment: reliant on 2 person assist for balance                           ADL either performed or assessed with clinical judgement   ADL      General ADL Comments: Nursing assisting pt with bathing and dressing upon arrival at bed level. 2 person assist provided for LB ADL and 1 person assist for UB ADL. Total A x2 for toileting hygiene.  Cognition Arousal/Alertness: Awake/alert Behavior During Therapy: Anxious Overall Cognitive Status: History of  cognitive impairments - at baseline       General Comments: Pt repeating "please don't hurt me" even when not doing any mobility. High anxiety related to pain              General Comments VSS on RA    Pertinent Vitals/ Pain       Pain Assessment Pain Assessment: Faces Faces Pain Scale: Hurts whole lot Pain Location: right hip during any movement and mobility Pain Descriptors / Indicators: Grimacing, Guarding, Other (Comment), Aching, Moaning (yelling) Pain Intervention(s): Repositioned, Relaxation, Monitored during session, Limited activity within patient's tolerance         Frequency  Min 2X/week        Progress Toward Goals  OT Goals(current goals can now be found in the care plan section)  Progress towards OT goals: Progressing toward goals     Plan Discharge plan remains appropriate;Frequency remains appropriate    Co-evaluation    PT/OT/SLP Co-Evaluation/Treatment: Yes Reason for Co-Treatment: To address functional/ADL transfers;For patient/therapist safety;Necessary to address cognition/behavior during functional activity   OT goals addressed during session: Strengthening/ROM;ADL's and self-care      AM-PAC OT "6 Clicks" Daily Activity     Outcome Measure   Help from another person eating meals?: A Little Help from another person taking care of personal grooming?: A Little Help from another person toileting, which includes using toliet, bedpan, or urinal?: Total Help from another person bathing (including washing, rinsing, drying)?: Total Help from another person to put on and taking off regular upper body clothing?: A Little Help from another person to put on and taking off regular lower body clothing?: Total 6 Click Score: 12    End of Session Equipment Utilized During Treatment: Gait belt  OT Visit Diagnosis: Unsteadiness on feet (R26.81);Muscle weakness (generalized) (M62.81);History of falling (Z91.81);Pain Pain - Right/Left: Right Pain -  part of body: Hip   Activity Tolerance Patient tolerated treatment well   Patient Left in chair;with call bell/phone within reach;with chair alarm set   Nurse Communication Mobility status;Need for lift equipment        Time: 1035-1058 OT Time Calculation (min): 23 min  Charges: OT General Charges $OT Visit: 1 Visit OT Treatments $Therapeutic Activity: 8-22 mins  Limmie Patricia, OTR/L,CBIS  Supplemental OT - [facility] and [facility] Secure Chat Preferred    Muhannad Bignell, Charisse March 11/02/2022, 11:36 AM

## 2022-11-03 DIAGNOSIS — I825Y2 Chronic embolism and thrombosis of unspecified deep veins of left proximal lower extremity: Secondary | ICD-10-CM | POA: Diagnosis not present

## 2022-11-03 DIAGNOSIS — D649 Anemia, unspecified: Secondary | ICD-10-CM | POA: Diagnosis not present

## 2022-11-03 DIAGNOSIS — S72001A Fracture of unspecified part of neck of right femur, initial encounter for closed fracture: Secondary | ICD-10-CM | POA: Diagnosis not present

## 2022-11-03 DIAGNOSIS — E785 Hyperlipidemia, unspecified: Secondary | ICD-10-CM | POA: Diagnosis not present

## 2022-11-03 LAB — CBC
HCT: 31.9 % — ABNORMAL LOW (ref 36.0–46.0)
Hemoglobin: 10.1 g/dL — ABNORMAL LOW (ref 12.0–15.0)
MCH: 32.8 pg (ref 26.0–34.0)
MCHC: 31.7 g/dL (ref 30.0–36.0)
MCV: 103.6 fL — ABNORMAL HIGH (ref 80.0–100.0)
Platelets: 351 10*3/uL (ref 150–400)
RBC: 3.08 MIL/uL — ABNORMAL LOW (ref 3.87–5.11)
RDW: 18.9 % — ABNORMAL HIGH (ref 11.5–15.5)
WBC: 6.8 10*3/uL (ref 4.0–10.5)
nRBC: 0 % (ref 0.0–0.2)

## 2022-11-03 LAB — COMPREHENSIVE METABOLIC PANEL
ALT: 28 U/L (ref 0–44)
AST: 80 U/L — ABNORMAL HIGH (ref 15–41)
Albumin: 1.9 g/dL — ABNORMAL LOW (ref 3.5–5.0)
Alkaline Phosphatase: 210 U/L — ABNORMAL HIGH (ref 38–126)
Anion gap: 6 (ref 5–15)
BUN: 21 mg/dL (ref 8–23)
CO2: 24 mmol/L (ref 22–32)
Calcium: 8.3 mg/dL — ABNORMAL LOW (ref 8.9–10.3)
Chloride: 109 mmol/L (ref 98–111)
Creatinine, Ser: 1.04 mg/dL — ABNORMAL HIGH (ref 0.44–1.00)
GFR, Estimated: 56 mL/min — ABNORMAL LOW (ref >60)
Glucose, Bld: 81 mg/dL (ref 70–99)
Potassium: 4.1 mmol/L (ref 3.5–5.1)
Sodium: 139 mmol/L (ref 135–145)
Total Bilirubin: 1 mg/dL (ref 0.3–1.2)
Total Protein: 5.6 g/dL — ABNORMAL LOW (ref 6.5–8.1)

## 2022-11-03 MED ORDER — SENNOSIDES-DOCUSATE SODIUM 8.6-50 MG PO TABS: 1.0000 | ORAL_TABLET | Freq: Two times a day (BID) | ORAL | Status: AC | PRN

## 2022-11-03 MED ORDER — ATORVASTATIN CALCIUM 20 MG PO TABS: 20.0000 mg | ORAL_TABLET | Freq: Every day | ORAL | Status: AC

## 2022-11-03 NOTE — Discharge Summary (Signed)
Physician Discharge Summary  Ebony Olson:[id number] DOB: 07-01-1945 DOA: 10/24/2022  PCP: Glenis Smoker, MD  Admit date: 10/24/2022 Discharge date: 11/03/2022 Admitted From: Home Disposition: SNF Recommendations for Outpatient Follow-up:  Outpatient follow-up as below Outpatient follow-up with GI in 3 to 4 weeks Check CMP and CBC in 1 week Please follow up on the following pending results: None   Discharge Condition: Stable CODE STATUS: Full code  Follow-up Information     Haddix, Gillie Manners, MD. Schedule an appointment as soon as possible for a visit in 2 week(s).   Specialty: Orthopedic Surgery Why: for wound check and repeat x-rays Contact information: [address] [place] [address] [phone]                 Hospital course 77 y.o. female with medical history significant of moderate dementia, left lower extremity DVT s/p thrombectomy (04/2022), pulmonary embolism on Eliquis, hypertension, PSVT, TIA, chronic anemia and osteoporosis who presented after a mechanical fall and found to have right leg intertrochanteric fracture and symptomatic anemia with Hgb of 7.8 (unknown baseline).  Patient underwent cephalomedullary nail fixation by Dr. Jena Gauss on 7/7.  Patient was transfused 2 units with appropriate response.  Eliquis held perioperatively.  Hemoccult was positive but no overt bleeding.  GI consulted.  Since H&H remained stable even after resuming Eliquis, GI recommended outpatient follow-up with a gastroenterologist for further evaluation.  Therapy recommended SNF.  See individual problem list below for more.   Problems addressed during this hospitalization Principal Problem:   Closed right hip fracture, initial encounter (HCC) Active Problems:   Symptomatic anemia   Dyslipidemia   DVT (deep venous thrombosis) (HCC)   Dementia (HCC)   GERD (gastroesophageal reflux disease)   Closed right hip fracture due to mechanical fall, initial encounter (HCC)   -S/p cephalomedullary nailing by Dr. Jena Gauss on 7/7 -Percocet and Tylenol for pain control. -Continued on eliquis -PT/OT -Outpatient follow-up with orthopedic surgery as above    Symptomatic anemia: Hgb 7.8 on admission (unknown baseline).  Suspect chronic blood loss anemia.  Hemoccult positive but no overt bleeding.  Transfused 2 units with appropriate response.  GI recommended outpatient capsule study with her primary GI physician after patient recovers from hip fracture.  -Check CBC in 1 week  -Outpatient follow-up with GI in 3 to 4 weeks -Continue home Dexilant   Dyslipidemia -Resume statin in 1 week after CMP result  Mild elevated AST: -Hold statin -Check CMP in 1 week   History of DVT/PE -Continue Eliquis   Dementia without behavioral disturbance -Continue Aricept, memantine, Remeron   GERD -Continue home Dexilant   Conjunctivitis: Resolved  Hypoxia: Resolved.  Pressure skin injury: Pressure Injury 11/01/22 Vertebral column Medial Stage 1 -  Intact skin with non-blanchable redness of a localized area usually over a bony prominence. blue purple (Active)  11/01/22 1600  Location: Vertebral column  Location Orientation: Medial  Staging: Stage 1 -  Intact skin with non-blanchable redness of a localized area usually over a bony prominence.  Wound Description (Comments): blue purple  Present on Admission: No  Dressing Type Foam - Lift dressing to assess site every shift 11/03/22 1019    Time spent 35 minutes  Vital signs Vitals:   11/02/22 1237 11/02/22 1937 11/03/22 0437 11/03/22 0821  BP: (!) 106/54 (!) 104/50 (!) 131/55 130/61  Pulse: 97 91 93 97  Temp: 98.5 F (36.9 C)  (!) 97.2 F (36.2 C) 98 F (36.7 C)  Resp: 18  15 17   Height:      Weight:      SpO2: 96% 94% 94% 94%  TempSrc: Oral   Oral  BMI (Calculated):         Discharge exam  GENERAL: No apparent distress.  Nontoxic. HEENT: MMM.  Vision and hearing grossly intact.  NECK: Supple.  No  apparent JVD.  RESP:  No IWOB.  Fair aeration bilaterally. CVS:  RRR. Heart sounds normal.  ABD/GI/GU: BS+. Abd soft, NTND.  MSK/EXT:  Moves extremities. No apparent deformity. No edema.  SKIN: Surgical wound on right thigh healing. NEURO: Awake and alert. Oriented to self, place and person.  No apparent focal neuro deficit. PSYCH: Calm. Normal affect.   Discharge Instructions Discharge Instructions     Diet general   Complete by: As directed    Increase activity slowly   Complete by: As directed       Allergies as of 11/03/2022       Reactions   Lunesta [eszopiclone] Shortness Of Breath   Prednisone Anxiety   Furosemide Other (See Comments)   Penicillins Rash        Medication List     STOP taking these medications    simvastatin 80 MG tablet Commonly known as: ZOCOR Replaced by: atorvastatin 20 MG tablet       TAKE these medications    acetaminophen 325 MG tablet Commonly known as: TYLENOL Take 2 tablets (650 mg total) by mouth every 6 (six) hours as needed for mild pain, moderate pain, headache or fever (or Fever >/= 101).   albuterol 108 (90 Base) MCG/ACT inhaler Commonly known as: VENTOLIN HFA Inhale into the lungs every 6 (six) hours as needed for wheezing or shortness of breath.   atorvastatin 20 MG tablet Commonly known as: LIPITOR Take 1 tablet (20 mg total) by mouth daily. Start taking on: November 10, 2022 Replaces: simvastatin 80 MG tablet   cyanocobalamin 1000 MCG tablet Commonly known as: VITAMIN B12 Take 1,000 mcg by mouth daily. Sq Once a month   Dexilant 60 MG capsule Generic drug: dexlansoprazole Take 60 mg by mouth daily.   donepezil 10 MG tablet Commonly known as: ARICEPT Take 10 mg by mouth at bedtime.   Eliquis 5 MG Tabs tablet Generic drug: apixaban Take 5 mg by mouth 2 (two) times daily.   ferrous sulfate 325 (65 FE) MG tablet Take 325 mg by mouth daily.   memantine 10 MG tablet Commonly known as: NAMENDA Take 10 mg by  mouth 2 (two) times daily.   methocarbamol 500 MG tablet Commonly known as: ROBAXIN Take 1 tablet (500 mg total) by mouth every 8 (eight) hours as needed for muscle spasms.   mirtazapine 7.5 MG tablet Commonly known as: REMERON Take 7.5 mg by mouth at bedtime.   oxyCODONE-acetaminophen 5-325 MG tablet Commonly known as: PERCOCET/ROXICET Take 1 tablet by mouth every 6 (six) hours as needed for severe pain.   senna-docusate 8.6-50 MG tablet Commonly known as: Senokot-S Take 1 tablet by mouth 2 (two) times daily between meals as needed for mild constipation.        Consultations: Orthopedic surgery  Procedures/Studies: Cephalomedullary nailing of right femoral fracture by Dr. Jena Gauss on 7/7   DG HIP PORT UNILAT W OR W/O PELVIS 1V RIGHT  Result Date: 10/24/2022 CLINICAL DATA:  Right hip ORIF EXAM: DG HIP (WITH OR WITHOUT PELVIS) 1V PORT RIGHT COMPARISON:  10/24/2022 FINDINGS: Interval ORIF of intertrochanteric right femur fracture with improved alignment. No  new fracture or dislocation. Expected postoperative changes within the overlying soft tissues. IMPRESSION: Interval ORIF of intertrochanteric right femur fracture with improved alignment. Electronically Signed   By: Duanne Guess D.O.   On: 10/24/2022 14:03   DG HIP UNILAT WITH PELVIS 2-3 VIEWS RIGHT  Result Date: 10/24/2022 CLINICAL DATA:  Intraoperative imaging for right proximal femur fracture ORIF. EXAM: OPERATIVE RIGHT HIP (WITH PELVIS IF PERFORMED) 5 VIEWS TECHNIQUE: Fluoroscopic spot image(s) were submitted for interpretation post-operatively. COMPARISON:  10/24/2022 at 7:08 a.m. FINDINGS: Images demonstrate placement of a short intramedullary rod supporting 2 fixation screws, reducing the intertrochanteric fracture into near anatomic alignment. Orthopedic hardware appears well seated. IMPRESSION: Portable imaging provided for right proximal femur ORIF. Electronically Signed   By: Amie Portland M.D.   On: 10/24/2022 11:47    DG C-Arm 1-60 Min-No Report  Result Date: 10/24/2022 Fluoroscopy was utilized by the requesting physician.  No radiographic interpretation.   DG HIP PORT UNILAT WITH PELVIS 1V RIGHT  Result Date: 10/24/2022 CLINICAL DATA:  Hip fracture EXAM: DG HIP (WITH OR WITHOUT PELVIS) 3V PORT RIGHT COMPARISON:  None Available. FINDINGS: Acute intertrochanteric right femur fracture with mild impaction. Subjective osteopenia. No evidence of pelvic ring fracture or diastasis. IMPRESSION: Acute intertrochanteric right femur fracture. Electronically Signed   By: Tiburcio Pea M.D.   On: 10/24/2022 08:04       The results of significant diagnostics from this hospitalization (including imaging, microbiology, ancillary and laboratory) are listed below for reference.     Microbiology: No results found for this or any previous visit (from the past 240 hour(s)).   Labs:  CBC: Recent Labs  Lab 10/30/22 0338 10/31/22 0222 11/01/22 0903 11/02/22 0206 11/03/22 0120  WBC 6.7 6.3 5.7 6.6 6.8  HGB 10.2* 9.7* 10.7* 10.3* 10.1*  HCT 32.0* 30.5* 33.9* 33.6* 31.9*  MCV 97.9 101.7* 100.6* 101.8* 103.6*  PLT 226 234 285 307 351   BMP &GFR Recent Labs  Lab 10/28/22 0924 10/29/22 0212 10/31/22 0222 11/01/22 0903 11/03/22 0325  NA 138 138 139 140 139  K 4.5 4.7 3.9 4.2 4.1  CL 107 108 108 106 109  CO2 24 22 25 25 24   GLUCOSE 149* 103* 98 110* 81  BUN 18 17 17 15 21   CREATININE 1.00 0.90 0.90 0.91 1.04*  CALCIUM 8.2* 8.5* 8.5* 8.8* 8.3*   Estimated Creatinine Clearance: 45.5 mL/min (A) (by C-G formula based on SCr of 1.04 mg/dL (H)). Liver & Pancreas: Recent Labs  Lab 10/28/22 0924 10/29/22 0212 10/31/22 0222 11/01/22 0903 11/03/22 0325  AST 24 36 53* 57* 80*  ALT 6 9 16 20 28   ALKPHOS 173* 208* 186* 178* 210*  BILITOT 1.5* 1.6* 1.1 1.1 1.0  PROT 4.9* 5.6* 5.0* 5.2* 5.6*  ALBUMIN 1.8* 2.0* 1.8* 1.9* 1.9*   No results for input(s): "LIPASE", "AMYLASE" in the last 168 hours. No results  for input(s): "AMMONIA" in the last 168 hours. Diabetic: No results for input(s): "HGBA1C" in the last 72 hours. No results for input(s): "GLUCAP" in the last 168 hours. Cardiac Enzymes: No results for input(s): "CKTOTAL", "CKMB", "CKMBINDEX", "TROPONINI" in the last 168 hours. No results for input(s): "PROBNP" in the last 8760 hours. Coagulation Profile: No results for input(s): "INR", "PROTIME" in the last 168 hours. Thyroid Function Tests: No results for input(s): "TSH", "T4TOTAL", "FREET4", "T3FREE", "THYROIDAB" in the last 72 hours. Lipid Profile: No results for input(s): "CHOL", "HDL", "LDLCALC", "TRIG", "CHOLHDL", "LDLDIRECT" in the last 72 hours. Anemia Panel: No  results for input(s): "VITAMINB12", "FOLATE", "FERRITIN", "TIBC", "IRON", "RETICCTPCT" in the last 72 hours. Urine analysis: No results found for: "COLORURINE", "APPEARANCEUR", "LABSPEC", "PHURINE", "GLUCOSEU", "HGBUR", "BILIRUBINUR", "KETONESUR", "PROTEINUR", "UROBILINOGEN", "NITRITE", "LEUKOCYTESUR" Sepsis Labs: Invalid input(s): "PROCALCITONIN", "LACTICIDVEN"   SIGNED:  Almon Hercules, MD  [facility] 11/03/2022, 12:08 PM

## 2022-11-03 NOTE — Plan of Care (Signed)
  Problem: Education: Goal: Knowledge of General Education information will improve Description: Including pain rating scale, medication(s)/side effects and non-pharmacologic comfort measures 11/03/2022 1312 by Milderd Meager, RN Outcome: Adequate for Discharge 11/03/2022 1112 by Milderd Meager, RN Outcome: Progressing   Problem: Health Behavior/Discharge Planning: Goal: Ability to manage health-related needs will improve Outcome: Adequate for Discharge   Problem: Clinical Measurements: Goal: Ability to maintain clinical measurements within normal limits will improve 11/03/2022 1312 by Milderd Meager, RN Outcome: Adequate for Discharge 11/03/2022 1112 by Milderd Meager, RN Outcome: Progressing Goal: Will remain free from infection 11/03/2022 1312 by Milderd Meager, RN Outcome: Adequate for Discharge 11/03/2022 1112 by Milderd Meager, RN Outcome: Progressing Goal: Diagnostic test results will improve Outcome: Adequate for Discharge Goal: Respiratory complications will improve Outcome: Adequate for Discharge Goal: Cardiovascular complication will be avoided Outcome: Adequate for Discharge   Problem: Activity: Goal: Risk for activity intolerance will decrease Outcome: Adequate for Discharge   Problem: Nutrition: Goal: Adequate nutrition will be maintained Outcome: Adequate for Discharge   Problem: Coping: Goal: Level of anxiety will decrease Outcome: Adequate for Discharge   Problem: Elimination: Goal: Will not experience complications related to bowel motility Outcome: Adequate for Discharge Goal: Will not experience complications related to urinary retention Outcome: Adequate for Discharge   Problem: Pain Managment: Goal: General experience of comfort will improve 11/03/2022 1312 by Milderd Meager, RN Outcome: Adequate for Discharge 11/03/2022 1112 by Milderd Meager, RN Outcome: Progressing   Problem: Safety: Goal: Ability to remain free from injury will  improve 11/03/2022 1312 by Milderd Meager, RN Outcome: Adequate for Discharge 11/03/2022 1112 by Milderd Meager, RN Outcome: Progressing   Problem: Skin Integrity: Goal: Risk for impaired skin integrity will decrease Outcome: Adequate for Discharge

## 2022-11-03 NOTE — Plan of Care (Signed)
  Problem: Education: Goal: Knowledge of General Education information will improve Description: Including pain rating scale, medication(s)/side effects and non-pharmacologic comfort measures Outcome: Progressing   Problem: Clinical Measurements: Goal: Ability to maintain clinical measurements within normal limits will improve Outcome: Progressing Goal: Will remain free from infection Outcome: Progressing   Problem: Pain Managment: Goal: General experience of comfort will improve Outcome: Progressing   Problem: Safety: Goal: Ability to remain free from injury will improve Outcome: Progressing

## 2022-11-03 NOTE — TOC Progression Note (Addendum)
Transition of Care [facility]) - Progression Note    Patient Details  Name: Ebony Olson MRN: [id number] Date of Birth: 16-Jul-1945  Transition of Care [facility]) CM/SW Contact  Lorri Frederick, LCSW Phone Number: 11/03/2022, 9:42 AM  Clinical Narrative:    Auth approved in [place]: [id number], [id number], 3 days: 7/17-7/19.  CSW confirmed with Colin Rhein that they can receive pt today.  MD informed.    1225: DC summary faxed to [facility]  Expected Discharge Plan: IP Rehab Facility (IR vs SNF) Barriers to Discharge: Continued Medical Work up, English as a second language teacher  Expected Discharge Plan and Services                                               Social Determinants of Health (SDOH) Interventions SDOH Screenings   Food Insecurity: No Food Insecurity (10/24/2022)  Housing: Low Risk  (10/24/2022)  Transportation Needs: No Transportation Needs (10/24/2022)  Utilities: Not At Risk (10/24/2022)  Tobacco Use: Unknown (10/24/2022)    Readmission Risk Interventions     No data to display

## 2022-11-03 NOTE — Progress Notes (Signed)
Discharge report called to Erle Crocker, RN at [facility].

## 2022-11-03 NOTE — TOC Transition Note (Signed)
Transition of Care [facility]) - CM/SW Discharge Note   Patient Details  Name: Ebony Olson MRN: [id number] Date of Birth: [birth year]  [facility]) CM/SW Contact:  Lorri Frederick, LCSW Phone Number: 11/03/2022, 12:28 PM   Clinical Narrative:   Pt discharging to [facility] in [place].  RN call report to [phone].     Final next level of care: Skilled Nursing Facility Barriers to Discharge: Barriers Resolved   Patient Goals and CMS Choice      Discharge Placement                Patient chooses bed at:  [facility]) Patient to be transferred to facility by: [facility] Name of family member notified: daughter Toniann Fail in room Patient and family notified of of transfer: 11/03/22  Discharge Plan and Services Additional resources added to the After Visit Summary for                                       Social Determinants of Health (SDOH) Interventions SDOH Screenings   Food Insecurity: No Food Insecurity (10/24/2022)  Housing: Low Risk  (10/24/2022)  Transportation Needs: No Transportation Needs (10/24/2022)  Utilities: Not At Risk (10/24/2022)  Tobacco Use: Unknown (10/24/2022)     Readmission Risk Interventions     No data to display
